# Patient Record
Sex: Male | Born: 1935 | Race: White | Hispanic: No | Marital: Married | Smoking: Never smoker
Health system: Southern US, Community
[De-identification: ages and names within clinical notes are randomized; demographics above are authoritative.]

## PROBLEM LIST (undated history)

## (undated) DIAGNOSIS — I482 Chronic atrial fibrillation, unspecified: Secondary | ICD-10-CM

## (undated) DIAGNOSIS — N400 Enlarged prostate without lower urinary tract symptoms: Secondary | ICD-10-CM

## (undated) DIAGNOSIS — I10 Essential (primary) hypertension: Secondary | ICD-10-CM

## (undated) DIAGNOSIS — E785 Hyperlipidemia, unspecified: Secondary | ICD-10-CM

## (undated) HISTORY — PX: BACK SURGERY: SHX140

## (undated) HISTORY — PX: EYE SURGERY: SHX253

## (undated) HISTORY — DX: Essential (primary) hypertension: I10

## (undated) HISTORY — DX: Benign prostatic hyperplasia without lower urinary tract symptoms: N40.0

## (undated) HISTORY — DX: Hyperlipidemia, unspecified: E78.5

## (undated) HISTORY — DX: Chronic atrial fibrillation, unspecified: I48.20

---

## 2011-12-06 DIAGNOSIS — I10 Essential (primary) hypertension: Secondary | ICD-10-CM | POA: Insufficient documentation

## 2011-12-06 DIAGNOSIS — Z136 Encounter for screening for cardiovascular disorders: Secondary | ICD-10-CM | POA: Insufficient documentation

## 2011-12-06 DIAGNOSIS — Z87898 Personal history of other specified conditions: Secondary | ICD-10-CM | POA: Insufficient documentation

## 2011-12-06 DIAGNOSIS — R0989 Other specified symptoms and signs involving the circulatory and respiratory systems: Secondary | ICD-10-CM | POA: Insufficient documentation

## 2011-12-06 DIAGNOSIS — R002 Palpitations: Secondary | ICD-10-CM | POA: Insufficient documentation

## 2011-12-06 DIAGNOSIS — E785 Hyperlipidemia, unspecified: Secondary | ICD-10-CM | POA: Insufficient documentation

## 2011-12-06 DIAGNOSIS — Z85820 Personal history of malignant melanoma of skin: Secondary | ICD-10-CM | POA: Insufficient documentation

## 2011-12-07 DIAGNOSIS — F1011 Alcohol abuse, in remission: Secondary | ICD-10-CM | POA: Insufficient documentation

## 2012-02-03 DIAGNOSIS — M1710 Unilateral primary osteoarthritis, unspecified knee: Secondary | ICD-10-CM | POA: Insufficient documentation

## 2012-05-20 DIAGNOSIS — N183 Chronic kidney disease, stage 3 unspecified: Secondary | ICD-10-CM | POA: Insufficient documentation

## 2012-09-03 DIAGNOSIS — D72821 Monocytosis (symptomatic): Secondary | ICD-10-CM | POA: Insufficient documentation

## 2014-08-24 DIAGNOSIS — H3554 Dystrophies primarily involving the retinal pigment epithelium: Secondary | ICD-10-CM | POA: Insufficient documentation

## 2014-08-24 DIAGNOSIS — H35371 Puckering of macula, right eye: Secondary | ICD-10-CM | POA: Insufficient documentation

## 2014-08-24 DIAGNOSIS — H3581 Retinal edema: Secondary | ICD-10-CM | POA: Insufficient documentation

## 2015-05-09 DIAGNOSIS — H02421 Myogenic ptosis of right eyelid: Secondary | ICD-10-CM | POA: Insufficient documentation

## 2015-10-03 DIAGNOSIS — H02831 Dermatochalasis of right upper eyelid: Secondary | ICD-10-CM | POA: Insufficient documentation

## 2015-10-03 DIAGNOSIS — H02403 Unspecified ptosis of bilateral eyelids: Secondary | ICD-10-CM | POA: Insufficient documentation

## 2015-10-03 DIAGNOSIS — H02834 Dermatochalasis of left upper eyelid: Secondary | ICD-10-CM | POA: Insufficient documentation

## 2015-10-03 DIAGNOSIS — H02413 Mechanical ptosis of bilateral eyelids: Secondary | ICD-10-CM | POA: Insufficient documentation

## 2016-04-16 DIAGNOSIS — I2510 Atherosclerotic heart disease of native coronary artery without angina pectoris: Secondary | ICD-10-CM | POA: Insufficient documentation

## 2016-05-14 DIAGNOSIS — I447 Left bundle-branch block, unspecified: Secondary | ICD-10-CM | POA: Insufficient documentation

## 2016-08-26 DIAGNOSIS — Z7901 Long term (current) use of anticoagulants: Secondary | ICD-10-CM | POA: Insufficient documentation

## 2016-11-12 ENCOUNTER — Ambulatory Visit (INDEPENDENT_AMBULATORY_CARE_PROVIDER_SITE_OTHER): Payer: Medicare Other | Admitting: Internal Medicine

## 2016-11-12 VITALS — BP 140/76 | HR 62

## 2016-11-12 DIAGNOSIS — I482 Chronic atrial fibrillation, unspecified: Secondary | ICD-10-CM

## 2016-11-12 DIAGNOSIS — R0609 Other forms of dyspnea: Secondary | ICD-10-CM

## 2016-11-12 NOTE — Progress Notes (Signed)
ELECTROPHYSIOLOGY CONSULT NOTE  Patient ID: Adam Barajas, MRN: [id number], DOB/AGE: 1929-11-28 83 y.o. Admit date: (Not on file) Date of Consult: 11/12/2016  Primary Physician: Dortha Kern, MD Primary Cardiologist: Cameron Ali  Consulting Physician Bliss  Chief Complaint: Atrial fibrillation and exercise intolerance    HPI Adam Barajas is a 83 y.o. male  Referred at his own request because of frustrations related to worsening exercise tolerance.  He wonders whether there is value to pacing.  He has a long-standing history of atrial fibrillation. This goes back at least 5+ years. He has been seen at [facility].  Cardiac evaluation has included stress testing which I do not been able to bring up. Myoview 6/17 demonstrated no ischemia. Echocardiogram at that same time demonstrated ejection fraction of 40 or so percent. Interestingly, left atrial dimension was less than it had been noted a couple years before at 4.2--<<5.0  remote catheterization 2009 demonstrated nonobstructive disease.  He was seen by EP in 2014 for consideration of pacing. Holter monitoring at that time is reported as having demonstrated some nocturnal bradycardia and rates in the 70s and peak rates in the 110 range. No indication for pacing was evident.  More recently his exercise tolerance has been problematic. He feels that relatively unpredictably his ability to accomplish tasks is significantly worse than at other times. In this regard it is notable that he is in permanent atrial fibrillation. Overall however, his exercise tolerance is considerably worse in a year ago. He notes that when he moves a wheelbarrow 1 for his fireplace that he needs to stop 3 or 4 times pushing barrel up a hill.  He does not have resting dyspnea orthopnea. He has a little bit of peripheral edema. He has had no tachycardia palpitations.  He has had no bleeding issues on anticoagulation which has been warfarin for some time.  Reviewed  from [facility] through [facility] Everywhere  They are frequently notes referring to heavy alcohol use.       Past Medical History:  Diagnosis Date  . BPH (benign prostatic hyperplasia)   . Chronic atrial fibrillation (HCC)   . Hyperlipidemia   . Hypertension       Surgical History: No past surgical history on file.   Home Meds: Prior to Admission medications   Medication Sig Start Date End Date Taking? Authorizing Provider  albuterol (PROVENTIL HFA;VENTOLIN HFA) 108 (90 Base) MCG/ACT inhaler Inhale into the lungs every 6 (six) hours as needed for wheezing or shortness of breath.    Historical Provider, MD  atorvastatin (LIPITOR) 40 MG tablet Take 40 mg by mouth daily.    Historical Provider, MD  carvedilol (COREG) 6.25 MG tablet Take 6.25 mg by mouth 2 (two) times daily with a meal.    Historical Provider, MD  cyclobenzaprine (FLEXERIL) 5 MG tablet Take 5 mg by mouth 3 (three) times daily as needed for muscle spasms.    Historical Provider, MD  furosemide (LASIX) 40 MG tablet Take 40 mg by mouth daily.    Historical Provider, MD  losartan (COZAAR) 25 MG tablet Take 25 mg by mouth daily.    Historical Provider, MD  tamsulosin (FLOMAX) 0.4 MG CAPS capsule Take 0.4 mg by mouth.    Historical Provider, MD  warfarin (COUMADIN) 5 MG tablet Take 5 mg by mouth daily.    Historical Provider, MD    Allergies:  Allergies  Allergen Reactions  . Amoxicillin-Pot Clavulanate Hives  . Poison Ivy Extract  Social History   Social History  . Marital status: Married    Spouse name: N/A  . Number of children: N/A  . Years of education: N/A   Occupational History  . Not on file.   Social History Main Topics  . Smoking status: Never Smoker  . Smokeless tobacco: Never Used  . Alcohol use Yes     Comment: 2-3  drinks per day.   . Drug use: No  . Sexual activity: Not on file   Other Topics Concern  . Not on file   Social History Narrative  . No narrative on file     Family History    Problem Relation Age of Onset  . Hyperlipidemia Father      ROS:  Please see the history of present illness.     All other systems reviewed and negative.    Physical Exam: Blood pressure 140/76, pulse 62. General: Well developed, well nourished male in no acute distress. Head: Normocephalic, atraumatic, sclera non-icteric, no xanthomas, nares are without discharge. EENT: normal  Lymph Nodes:  none Neck: Negative for carotid bruits. JVD not elevated. Back:without scoliosis kyphosis Lungs: Clear bilaterally to auscultation without wheezes, rales, or rhonchi. Breathing is unlabored. Heart: Irregularly irregular rate and rhythm with a 2 /6 systolic murmur . No rubs, or gallops appreciated. Abdomen: Soft, non-tender, non-distended with normoactive bowel sounds. No hepatomegaly. No rebound/guarding. No obvious abdominal masses. Msk:  Strength and tone appear normal for age. Extremities: No clubbing or cyanosis. No  edema.  Distal pedal pulses are 2+ and equal bilaterally. Skin: Warm and Dry Neuro: Alert and oriented X 3. CN III-XII intact Grossly normal sensory and motor function . Psych:  Responds to questions appropriately with a normal affect.      Labs: Cardiac Enzymes No results for input(s): CKTOTAL, CKMB, TROPONINI in the last 72 hours. CBC No results found for: WBC, HGB, HCT, MCV, PLT PROTIME: No results for input(s): LABPROT, INR in the last 72 hours. Chemistry No results for input(s): NA, K, CL, CO2, BUN, CREATININE, CALCIUM, PROT, BILITOT, ALKPHOS, ALT, AST, GLUCOSE in the last 168 hours.  Invalid input(s): LABALBU Lipids No results found for: CHOL, HDL, LDLCALC, TRIG BNP No results found for: PROBNP Thyroid Function Tests: No results for input(s): TSH, T4TOTAL, T3FREE, THYROIDAB in the last 72 hours.  Invalid input(s): FREET3 Miscellaneous No results found for: DDIMER  Radiology/Studies:  No results found.  EKG: Atrial fibrillation at  62 Intervals-/14/44 Axis deviation Left bundle branch block   Assessment and Plan:  Atrial fibrillation-permanent  Left bundle branch block  Nonischemic cardiomyopathy  Dyspnea on exertion/congestive heart failure  Myelodysplastic syndrome  Carotid disease with yearly follow-up recommended    The patient has atrial fibrillation that is permanent. His questions as to whether he would benefit from pacing. An office stress test today( with leg kicks)  suggest there may be a component of chronotropic incompetence as he was able only to generate a heart rate of 72. We will undertake cardiopulmonary stress testing looking for heart rate excursion to see if we can identify the source of his functional limitation.  His QRS duration is modestly prolonged. ECG from 3/14 had a QRS duration of 146; today is 136. I am not sure that his left bundle branch block is sufficiently broad to make it likely that resynchronization particularly in the context of atrial fibrillation is likely to benefit him.  The event that chronotropic competence is evident, resynchronization and/or His bundle pacing would be  a reasonable step.  For his cardiomyopathy he is managed with beta blockers and ARB.         Sherryl Manges

## 2016-11-12 NOTE — Patient Instructions (Addendum)
Medication Instructions: - Your physician recommends that you continue on your current medications as directed. Please refer to the Current Medication list given to you today.  Labwork: - none ordered  Procedures/Testing: - Your physician has recommended that you have a cardiopulmonary stress test (CPX). CPX testing is a non-invasive measurement of heart and lung function. It replaces a traditional treadmill stress test. This type of test provides a tremendous amount of information that relates not only to your present condition but also for future outcomes. This test combines measurements of you ventilation, respiratory gas exchange in the lungs, electrocardiogram (EKG), blood pressure and physical response before, during, and following an exercise protocol.  ** This will be scheduled to be done at [facility] in [place]**  Follow-Up: - Your physician recommends that you schedule a follow-up appointment in: 2 weeks after the CPX test is done with Dr. Graciela Husbands.   Any Additional Special Instructions Will Be Listed Below (If Applicable).     If you need a refill on your cardiac medications before your next appointment, please call your pharmacy.

## 2016-11-19 ENCOUNTER — Other Ambulatory Visit (HOSPITAL_COMMUNITY): Payer: Self-pay | Admitting: *Deleted

## 2016-11-19 ENCOUNTER — Ambulatory Visit (HOSPITAL_COMMUNITY): Payer: Medicare Other | Attending: Internal Medicine

## 2016-11-19 DIAGNOSIS — R0600 Dyspnea, unspecified: Secondary | ICD-10-CM

## 2016-11-19 DIAGNOSIS — R0609 Other forms of dyspnea: Secondary | ICD-10-CM | POA: Diagnosis not present

## 2016-11-28 ENCOUNTER — Encounter: Payer: Self-pay | Admitting: Internal Medicine

## 2016-11-28 ENCOUNTER — Ambulatory Visit (INDEPENDENT_AMBULATORY_CARE_PROVIDER_SITE_OTHER): Payer: Medicare Other | Admitting: Internal Medicine

## 2016-11-28 VITALS — BP 132/64 | HR 53 | Ht 65.0 in | Wt 154.2 lb

## 2016-11-28 DIAGNOSIS — I482 Chronic atrial fibrillation: Secondary | ICD-10-CM

## 2016-11-28 DIAGNOSIS — I428 Other cardiomyopathies: Secondary | ICD-10-CM

## 2016-11-28 DIAGNOSIS — I447 Left bundle-branch block, unspecified: Secondary | ICD-10-CM

## 2016-11-28 DIAGNOSIS — I4821 Permanent atrial fibrillation: Secondary | ICD-10-CM

## 2016-11-28 NOTE — Patient Instructions (Addendum)
Medication Instructions: - Your physician has recommended you make the following change in your medication:  1) Decrease coreg (carvedilol) 6.25 mg- take 1/2 tablet (3.125 mg) by mouth twice daily  Labwork: - none ordered   Procedures/Testing: - none ordered  Follow-Up: - Your physician recommends that you schedule a follow-up appointment in: 3 months with Dr. Graciela Husbands.  Any Additional Special Instructions Will Be Listed Below (If Applicable).     If you need a refill on your cardiac medications before your next appointment, please call your pharmacy.

## 2016-11-28 NOTE — Progress Notes (Signed)
Patient Care Team: Dortha Kern, MD as PCP - General (Family Medicine)   HPI  Adam Barajas is a 83 y.o. male Seen in follow-up for exercise associated dyspnea with in office exercise testing left (leg kicks) worrisome for chronotropic incompetence.  He has permanent atrial fibrillation and a mild resumed nonischemic cardiomyopathy. He has ongoing episodes of dizziness which in his mind correlate with slow pulses.  He underwent cardiopulmonary stress testing. Maximum heart rate was 121  no significant limitations were identified  DATE TEST    6/17    myoview   EF 52 %  no ischemia  6/17    echo   EF 40-45 %  Mod LAE          Records and Results Reviewed   Past Medical History:  Diagnosis Date  . BPH (benign prostatic hyperplasia)   . Chronic atrial fibrillation (HCC)   . Hyperlipidemia   . Hypertension     History reviewed. No pertinent surgical history.  Current Outpatient Prescriptions  Medication Sig Dispense Refill  . albuterol (PROVENTIL HFA;VENTOLIN HFA) 108 (90 Base) MCG/ACT inhaler Inhale into the lungs every 6 (six) hours as needed for wheezing or shortness of breath.    Marland Kitchen atorvastatin (LIPITOR) 40 MG tablet Take 40 mg by mouth daily.    . carvedilol (COREG) 6.25 MG tablet Take 6.25 mg by mouth 2 (two) times daily with a meal.    . cyclobenzaprine (FLEXERIL) 5 MG tablet Take 5 mg by mouth 3 (three) times daily as needed for muscle spasms.    . furosemide (LASIX) 40 MG tablet Take 40 mg by mouth daily.    Marland Kitchen losartan (COZAAR) 25 MG tablet Take 25 mg by mouth daily.    . tamsulosin (FLOMAX) 0.4 MG CAPS capsule Take 0.4 mg by mouth.    . warfarin (COUMADIN) 5 MG tablet Take 5 mg by mouth daily.     No current facility-administered medications for this visit.     Allergies  Allergen Reactions  . Amoxicillin-Pot Clavulanate Hives  . Poison Ivy Extract       Review of Systems negative except from HPI and PMH  Physical Exam BP 132/64 (BP Location:  Left Arm, Patient Position: Sitting, Cuff Size: Normal)   Pulse (!) 53   Ht 5\' 5"  (1.651 m)   Wt 154 lb 4 oz (70 kg)   BMI 25.67 kg/m  Well developed and well nourished in no acute distress HENT normal E scleral and icterus clear Neck Supple JVP flat; carotids brisk and full Clear to ausculation irregular rate and rhythm, no murmurs gallops or rub Soft with active bowel sounds No clubbing cyanosis  Edema Alert and oriented, grossly normal motor and sensory function Skin Warm and Dry  ECG demonstrates atrial fibrillation at 53 Intervals-/13/46 Left bundle branch block  Assessment and  Plan  Left bundle branch block  Cardiomyopathy-mild  Atrial fibrillation-permanent  Dizziness  Bradycardia    Cardiopulmonary stress testing demonstrated adequate heart rate excursion and without excessive heart rate excursion. Given his ongoing symptoms of dizziness associated with him taking his pulse and feeling slow, we will decrease his carvedilol from 6.25--3.125 twice daily. In the event that he continues to have episodes, we would undertake a monitor of sufficient duration i.e. 15/30 days so as to look for correlation between rate and symptoms.  His QRS duration is not of sufficient breath to justify pacing for the left bundle branch block.  We discussed the  use of the NOACs compared to Coumadin. We briefly reviewed the data of at least comparability in stroke prevention, bleeding and outcome. We discussed some of the new once wherein somewhat associated with decreased ischemic stroke risk, one to be taken daily, and has been shown to be comparable and bleeding risk to aspirin.  We also discussed bleeding associated with warfarin as well as NOACs and a wall bleeding as a complication of all these drugs intracranial bleeding is more frequently associated with warfarin then the NOACs and a GI bleeding is more commonly associated with the latter  He will look into the costs   We spent  more than 50% of our >25 min visit in face to face counseling regarding the above     Current medicines are reviewed at length with the patient today .  The patient does not  have concerns regarding medicines.

## 2017-02-20 ENCOUNTER — Ambulatory Visit (INDEPENDENT_AMBULATORY_CARE_PROVIDER_SITE_OTHER): Payer: Medicare Other | Admitting: Internal Medicine

## 2017-02-20 ENCOUNTER — Encounter: Payer: Self-pay | Admitting: Internal Medicine

## 2017-02-20 VITALS — BP 132/60 | HR 69 | Ht 66.5 in | Wt 158.0 lb

## 2017-02-20 DIAGNOSIS — I4821 Permanent atrial fibrillation: Secondary | ICD-10-CM

## 2017-02-20 DIAGNOSIS — R001 Bradycardia, unspecified: Secondary | ICD-10-CM | POA: Diagnosis not present

## 2017-02-20 DIAGNOSIS — I447 Left bundle-branch block, unspecified: Secondary | ICD-10-CM

## 2017-02-20 DIAGNOSIS — I428 Other cardiomyopathies: Secondary | ICD-10-CM | POA: Diagnosis not present

## 2017-02-20 DIAGNOSIS — I482 Chronic atrial fibrillation: Secondary | ICD-10-CM | POA: Diagnosis not present

## 2017-02-20 NOTE — Patient Instructions (Signed)

## 2017-02-20 NOTE — Progress Notes (Signed)
Patient Care Team: Dortha Kern, MD as PCP - General (Family Medicine)   HPI  Adam Barajas is a 83 y.o. male Seen in follow-up for exercise associated dyspnea.  He has permanent atrial fibrillation and a mild resumed nonischemic cardiomyopathy  He underwent cardiopulmonary stress testing. Maximum heart rate was 121  No significant limitations were identified.   He has baseline left bundle branch block  Functional status remains pretty good but variable. Some days he can do it once other days he finds it harder. He does not notice correlations between good days preceding bad days, temperature,  He has ongoing episodes of dizziness which in his mind correlate with slow pulses.   DATE TEST    6/17    myoview   EF 52 %  no ischemia  6/17    echo   EF 40-45 %  Mod LAE          Records and Results Reviewed   Past Medical History:  Diagnosis Date  . BPH (benign prostatic hyperplasia)   . Chronic atrial fibrillation (HCC)   . Hyperlipidemia   . Hypertension     History reviewed. No pertinent surgical history.  Current Outpatient Prescriptions  Medication Sig Dispense Refill  . albuterol (PROVENTIL HFA;VENTOLIN HFA) 108 (90 Base) MCG/ACT inhaler Inhale into the lungs every 6 (six) hours as needed for wheezing or shortness of breath.    Marland Kitchen atorvastatin (LIPITOR) 40 MG tablet Take 40 mg by mouth daily.    . carvedilol (COREG) 6.25 MG tablet Take 1/2 tablet (3.125 mg) by mouth twice daily    . cyclobenzaprine (FLEXERIL) 5 MG tablet Take 5 mg by mouth 3 (three) times daily as needed for muscle spasms.    . furosemide (LASIX) 40 MG tablet Take 40 mg by mouth daily.    Marland Kitchen losartan (COZAAR) 25 MG tablet Take 25 mg by mouth daily.    . tamsulosin (FLOMAX) 0.4 MG CAPS capsule Take 0.4 mg by mouth.    . warfarin (COUMADIN) 5 MG tablet Take 5 mg by mouth daily.     No current facility-administered medications for this visit.     Allergies  Allergen Reactions  .  Amoxicillin-Pot Clavulanate Hives  . Poison Ivy Extract       Review of Systems negative except from HPI and PMH  Physical Exam BP 132/60 (BP Location: Left Arm, Patient Position: Sitting, Cuff Size: Normal)   Pulse 69   Ht 5' 6.5" (1.689 m)   Wt 158 lb (71.7 kg)   BMI 25.12 kg/m  Well developed and well nourished in no acute distress HENT normal E scleral and icterus clear Neck Supple JVP flat; carotids brisk and full Clear to ausculation irregular rate and rhythm, no murmurs gallops or rub Soft with active bowel sounds No clubbing cyanosis  Edema Alert and oriented, grossly normal motor and sensory function Skin Warm and Dry  ECG Personally reviewed  demonstrates atrial fibrillation at 69 Intervals-/13/44 Left bundle branch block  Assessment and  Plan  Left bundle branch block  Cardiomyopathy-mild  Atrial fibrillation-permanent  Bradycardia  Hypertension   DOE      Not sure of the mechanism of his variable exercise tolerance.  For now will continue bb and ARB given cardiomyopathy.  HR is better with downtitration of beta blocker although no significant change in exercise  Cardiomyopathy and LBBB, although related somehow--which cause and which effect-- should not explain the variability in daily function  Would like to continue to follow here as opposed to returning to [facility]   BP well controlled  Euvolemic continue current meds   Current medicines are reviewed at length with the patient today .  The patient does not  have concerns regarding medicines.

## 2017-08-28 ENCOUNTER — Encounter: Payer: Self-pay | Admitting: Internal Medicine

## 2017-08-28 ENCOUNTER — Ambulatory Visit (INDEPENDENT_AMBULATORY_CARE_PROVIDER_SITE_OTHER): Payer: Medicare Other | Admitting: Internal Medicine

## 2017-08-28 ENCOUNTER — Other Ambulatory Visit
Admission: RE | Admit: 2017-08-28 | Discharge: 2017-08-28 | Disposition: A | Discharge status: Home / Self Care | Payer: Medicare Other | Source: Ambulatory Visit | Attending: Internal Medicine | Admitting: Internal Medicine

## 2017-08-28 VITALS — BP 144/70 | HR 78 | Ht 67.0 in | Wt 159.8 lb

## 2017-08-28 DIAGNOSIS — I4891 Unspecified atrial fibrillation: Secondary | ICD-10-CM | POA: Insufficient documentation

## 2017-08-28 DIAGNOSIS — Z79899 Other long term (current) drug therapy: Secondary | ICD-10-CM | POA: Diagnosis not present

## 2017-08-28 LAB — CBC WITH DIFFERENTIAL/PLATELET
BASOS PCT: 1 %
Basophils Absolute: 0.1 10*3/uL (ref 0–0.1)
Eosinophils Absolute: 0.2 10*3/uL (ref 0–0.7)
Eosinophils Relative: 2 %
HCT: 37.9 % — ABNORMAL LOW (ref 40.0–52.0)
Hemoglobin: 12.6 g/dL — ABNORMAL LOW (ref 13.0–18.0)
Lymphocytes Relative: 18 %
Lymphs Abs: 1.5 10*3/uL (ref 1.0–3.6)
MCH: 32 pg (ref 26.0–34.0)
MCHC: 33.2 g/dL (ref 32.0–36.0)
MCV: 96.2 fL (ref 80.0–100.0)
MONO ABS: 1.2 10*3/uL — AB (ref 0.2–1.0)
MONOS PCT: 15 %
NEUTROS PCT: 64 %
Neutro Abs: 5 10*3/uL (ref 1.4–6.5)
Platelets: 193 10*3/uL (ref 150–440)
RBC: 3.95 MIL/uL — ABNORMAL LOW (ref 4.40–5.90)
RDW: 13.7 % (ref 11.5–14.5)
WBC: 7.9 10*3/uL (ref 3.8–10.6)

## 2017-08-28 LAB — BASIC METABOLIC PANEL
Anion gap: 13 (ref 5–15)
BUN: 31 mg/dL — ABNORMAL HIGH (ref 6–20)
CALCIUM: 9.9 mg/dL (ref 8.9–10.3)
CO2: 25 mmol/L (ref 22–32)
Chloride: 103 mmol/L (ref 101–111)
Creatinine, Ser: 0.95 mg/dL (ref 0.61–1.24)
GFR calc non Af Amer: >60 mL/min (ref >60)
GLUCOSE: 108 mg/dL — AB (ref 65–99)
Potassium: 4.6 mmol/L (ref 3.5–5.1)
Sodium: 141 mmol/L (ref 135–145)

## 2017-08-28 NOTE — Patient Instructions (Addendum)
Medication Instructions:  Your physician recommends that you continue on your current medications as directed. Please refer to the Current Medication list given to you today.   Labwork: Your physician recommends that you return for lab work in: TODAY (CBC, BMET). - Please go to the [facility]. You will check in at the front desk to the right as you walk into the atrium. Valet Parking is offered if needed.    Testing/Procedures: NONE  Follow-Up: Your physician wants you to follow-up in: 6-7 MONTHS WITH DR Graciela Husbands. You will receive a reminder letter in the mail two months in advance. If you don't receive a letter, please call our office to schedule the follow-up appointment.  If you need a refill on your cardiac medications before your next appointment, please call your pharmacy.

## 2017-08-28 NOTE — Progress Notes (Signed)
Patient Care Team: Dortha Kern, MD as PCP - General (Family Medicine)   HPI  Adam Barajas is a 83 y.o. male Seen in follow-up for exercise associated dyspnea.  He has permanent atrial fibrillation and a mild  nonischemic cardiomyopathy  He underwent cardiopulmonary stress testing. Maximum heart rate was 121  No significant limitations were identified.   He has baseline left bundle branch block  Functional status is pretty good. Occasional complaints of dyspnea with significant exertion. No chest pain. No edema. No bleeding.  No interval dizziness.   DATE TEST    6/17    myoview   EF 52 %  no ischemia  6/17    echo   EF 40-45 %  Mod LAE        Date Cr K Hgb  1/17 1.0 4.9    8/17    12.7     Records and Results Reviewed   Past Medical History:  Diagnosis Date  . BPH (benign prostatic hyperplasia)   . Chronic atrial fibrillation (HCC)   . Hyperlipidemia   . Hypertension     History reviewed. No pertinent surgical history.  Current Outpatient Prescriptions  Medication Sig Dispense Refill  . albuterol (PROVENTIL HFA;VENTOLIN HFA) 108 (90 Base) MCG/ACT inhaler Inhale into the lungs every 6 (six) hours as needed for wheezing or shortness of breath.    Marland Kitchen atorvastatin (LIPITOR) 40 MG tablet Take 40 mg by mouth daily.    . carvedilol (COREG) 6.25 MG tablet Take 1/2 tablet (3.125 mg) by mouth twice daily    . cyclobenzaprine (FLEXERIL) 5 MG tablet Take 5 mg by mouth 3 (three) times daily as needed for muscle spasms.    . furosemide (LASIX) 40 MG tablet Take 40 mg by mouth daily.    Marland Kitchen losartan (COZAAR) 25 MG tablet Take 25 mg by mouth daily.    . tamsulosin (FLOMAX) 0.4 MG CAPS capsule Take 0.4 mg by mouth.    . warfarin (COUMADIN) 5 MG tablet Take 5 mg by mouth daily.     No current facility-administered medications for this visit.     Allergies  Allergen Reactions  . Amoxicillin-Pot Clavulanate Hives  . Poison Ivy Extract       Review of Systems  negative except from HPI and PMH  Physical Exam BP (!) 144/70 (BP Location: Left Arm, Patient Position: Sitting, Cuff Size: Normal)   Pulse 78   Ht 5\' 7"  (1.702 m)   Wt 159 lb 12 oz (72.5 kg)   BMI 25.02 kg/m  Well developed and nourished in no acute distress HENT normal Neck supple with JVP-flat Carotids brisk and full without bruits Clear Irregularly irregular rate and rhythm with controlled ventricular response, no murmurs or gallops Abd-soft with active BS without hepatomegaly No Clubbing cyanosis edema Skin-warm and dry A & Oriented  Grossly normal sensory and motor function   ECG Personally reviewed atrial fibrillation at 78 Intervals-/13/41 Left bundle branch block   Assessment and  Plan  Left bundle branch block  Cardiomyopathy-mild  Atrial fibrillation-permanent  Hypertension   DOE   BP reasonably controlled at home  Euvolemic continue current meds  On Anticoagulation;  No bleeding issues   Will check BMET and Hgb  Current medicines are reviewed at length with the patient today .  The patient does not  have concerns regarding medicines.  We spent more than 50% of our >25 min visit in face to face counseling regarding the above

## 2017-09-01 ENCOUNTER — Telehealth: Payer: Self-pay

## 2017-09-01 NOTE — Telephone Encounter (Signed)
Spoke with Patients wife [name] per DPR who is aware and agreeable to results. Per her request I will fax results to PCP so that if PCP would like to manage elevated blood sugar and mild anemia she can. I am agreeable.

## 2018-05-28 ENCOUNTER — Ambulatory Visit (INDEPENDENT_AMBULATORY_CARE_PROVIDER_SITE_OTHER): Payer: Medicare Other | Admitting: Internal Medicine

## 2018-05-28 ENCOUNTER — Encounter: Payer: Self-pay | Admitting: Internal Medicine

## 2018-05-28 DIAGNOSIS — I447 Left bundle-branch block, unspecified: Secondary | ICD-10-CM | POA: Diagnosis not present

## 2018-05-28 DIAGNOSIS — I482 Chronic atrial fibrillation: Secondary | ICD-10-CM | POA: Diagnosis not present

## 2018-05-28 DIAGNOSIS — I428 Other cardiomyopathies: Secondary | ICD-10-CM | POA: Diagnosis not present

## 2018-05-28 DIAGNOSIS — I4821 Permanent atrial fibrillation: Secondary | ICD-10-CM

## 2018-05-28 NOTE — Progress Notes (Signed)
      Patient Care Team: Dortha Kern, MD as PCP - General (Family Medicine)   HPI  Adam Barajas is a 84 y.o. male Seen in follow-up for exercise associated dyspnea.  He has permanent atrial fibrillation and a mild  nonischemic cardiomyopathy  He underwent cardiopulmonary stress testing. Maximum heart rate was 121  No significant limitations were identified.   He has baseline left bundle branch block  The patient denies chest pain, shortness of breath, nocturnal dyspnea, orthopnea or peripheral edema.  There have been no palpitations, lightheadedness or syncope.    Biggest problem is numbness in his left arm for which he has seen neurology and neurosurgery but surgery has been excluded  DATE TEST    6/17    myoview   EF 52 %  no ischemia  6/17    echo   EF 40-45 %  Mod LAE        Date Cr K Hgb  1/17 1.0 4.9    8/17    12.7  10/18 0.95 4.6 12.6  7/19   12.6     Records and Results Reviewed   Past Medical History:  Diagnosis Date  . BPH (benign prostatic hyperplasia)   . Chronic atrial fibrillation (HCC)   . Hyperlipidemia   . Hypertension     History reviewed. No pertinent surgical history.  Current Outpatient Medications  Medication Sig Dispense Refill  . albuterol (PROVENTIL HFA;VENTOLIN HFA) 108 (90 Base) MCG/ACT inhaler Inhale into the lungs every 6 (six) hours as needed for wheezing or shortness of breath.    Marland Kitchen atorvastatin (LIPITOR) 40 MG tablet Take 40 mg by mouth daily.    . carvedilol (COREG) 6.25 MG tablet Take 1/2 tablet (3.125 mg) by mouth twice daily    . cyclobenzaprine (FLEXERIL) 5 MG tablet Take 5 mg by mouth 3 (three) times daily as needed for muscle spasms.    . furosemide (LASIX) 40 MG tablet Take 40 mg by mouth daily.    Marland Kitchen losartan (COZAAR) 25 MG tablet Take 25 mg by mouth daily.    . tamsulosin (FLOMAX) 0.4 MG CAPS capsule Take 0.4 mg by mouth.    . warfarin (COUMADIN) 5 MG tablet Take 5 mg by mouth daily.     No current  facility-administered medications for this visit.     Allergies  Allergen Reactions  . Amoxicillin-Pot Clavulanate Hives  . Poison Ivy Extract       Review of Systems negative except from HPI and PMH  Physical Exam BP 140/76 (BP Location: Left Arm, Patient Position: Sitting, Cuff Size: Normal)   Pulse 61   Ht 5' 7.5" (1.715 m)   Wt 157 lb 8 oz (71.4 kg)   BMI 24.30 kg/m  Well developed and nourished in no acute distress HENT normal Neck supple with JVP-flat Carotids brisk and full without bruits Clear Irregularly irregular rate and rhythm with controlled ventricular response, no murmurs or gallops Abd-soft with active BS without hepatomegaly No Clubbing cyanosis edema Skin-warm and dry A & Oriented  Grossly normal sensory and motor function   ECG atrial fibrillation at 61 Intervals-/13/46 Axis left -36 Assessment and  Plan  Left bundle branch block  Cardiomyopathy-mild  Atrial fibrillation-permanent  Hypertension   Blood pressure reasonably controlled.  Euvolemic continue current meds  On Anticoagulation;  No bleeding issues

## 2018-05-28 NOTE — Patient Instructions (Signed)

## 2018-10-07 DIAGNOSIS — M47812 Spondylosis without myelopathy or radiculopathy, cervical region: Secondary | ICD-10-CM | POA: Insufficient documentation

## 2019-12-03 DIAGNOSIS — R600 Localized edema: Secondary | ICD-10-CM | POA: Insufficient documentation

## 2019-12-03 DIAGNOSIS — L03115 Cellulitis of right lower limb: Secondary | ICD-10-CM | POA: Insufficient documentation

## 2019-12-04 DIAGNOSIS — F0390 Unspecified dementia without behavioral disturbance: Secondary | ICD-10-CM | POA: Insufficient documentation

## 2019-12-04 DIAGNOSIS — N179 Acute kidney failure, unspecified: Secondary | ICD-10-CM | POA: Insufficient documentation

## 2019-12-12 DIAGNOSIS — D692 Other nonthrombocytopenic purpura: Secondary | ICD-10-CM | POA: Insufficient documentation

## 2020-04-19 DIAGNOSIS — H35351 Cystoid macular degeneration, right eye: Secondary | ICD-10-CM | POA: Insufficient documentation

## 2020-06-27 ENCOUNTER — Ambulatory Visit (INDEPENDENT_AMBULATORY_CARE_PROVIDER_SITE_OTHER): Payer: Medicare Other | Admitting: Internal Medicine

## 2020-06-27 ENCOUNTER — Ambulatory Visit: Payer: Medicare Other | Admitting: Internal Medicine

## 2020-06-27 ENCOUNTER — Other Ambulatory Visit: Payer: Self-pay

## 2020-06-27 ENCOUNTER — Encounter: Payer: Self-pay | Admitting: Internal Medicine

## 2020-06-27 VITALS — BP 102/60 | HR 48 | Ht 67.5 in | Wt 136.8 lb

## 2020-06-27 DIAGNOSIS — I447 Left bundle-branch block, unspecified: Secondary | ICD-10-CM | POA: Diagnosis not present

## 2020-06-27 DIAGNOSIS — R0600 Dyspnea, unspecified: Secondary | ICD-10-CM

## 2020-06-27 DIAGNOSIS — I429 Cardiomyopathy, unspecified: Secondary | ICD-10-CM

## 2020-06-27 DIAGNOSIS — R0609 Other forms of dyspnea: Secondary | ICD-10-CM

## 2020-06-27 DIAGNOSIS — I10 Essential (primary) hypertension: Secondary | ICD-10-CM

## 2020-06-27 DIAGNOSIS — I4821 Permanent atrial fibrillation: Secondary | ICD-10-CM

## 2020-06-27 MED ORDER — APIXABAN 2.5 MG PO TABS: 2.5000 mg | ORAL_TABLET | Freq: Two times a day (BID) | ORAL | 3 refills | Status: DC

## 2020-06-27 NOTE — Patient Instructions (Addendum)
Medication Instructions:  - Your physician has recommended you make the following change in your medication:   1) STOP coreg (carvedilol)  2) DECREASE eliquis to 2.5 mg- take 1 tablet by mouth twice daily  3) INCREASE lasix (furosemide) 40 mg- take 1 tablet by mouth twice daily x 5 days, then resume 1 tablet by mouth once daily   *If you need a refill on your cardiac medications before your next appointment, please call your pharmacy*   Lab Work: - Your physician recommends that you have lab work today: BNP/ IRON panel / CBC  If you have labs (blood work) drawn today and your tests are completely normal, you will receive your results only by: Marland Kitchen MyChart Message (if you have MyChart) OR . A paper copy in the mail If you have any lab test that is abnormal or we need to change your treatment, we will call you to review the results.   Testing/Procedures: - Your physician has requested that you have an echocardiogram. Echocardiography is a painless test that uses sound waves to create images of your heart. It provides your doctor with information about the size and shape of your heart and how well your heart's chambers and valves are working. This procedure takes approximately one hour. There are no restrictions for this procedure.   Follow-Up: At [facility], you and your health needs are our priority.  As part of our continuing mission to provide you with exceptional heart care, we have created designated Provider Care Teams.  These Care Teams include your primary Cardiologist (physician) and Advanced Practice Providers (APPs -  Physician Assistants and Nurse Practitioners) who all work together to provide you with the care you need, when you need it.  We recommend signing up for the patient portal called "MyChart".  Sign up information is provided on this After Visit Summary.  MyChart is used to connect with patients for Virtual Visits (Telemedicine).  Patients are able to view lab/test  results, encounter notes, upcoming appointments, etc.  Non-urgent messages can be sent to your provider as well.   To learn more about what you can do with MyChart, go to [email or web address].    Your next appointment:   6 month(s)  The format for your next appointment:   In Person  Provider:   Sherryl Manges, MD   Other Instructions  Echocardiogram An echocardiogram is a procedure that uses painless sound waves (ultrasound) to produce an image of the heart. Images from an echocardiogram can provide important information about:  Signs of coronary artery disease (CAD).  Aneurysm detection. An aneurysm is a weak or damaged part of an artery wall that bulges out from the normal force of blood pumping through the body.  Heart size and shape. Changes in the size or shape of the heart can be associated with certain conditions, including heart failure, aneurysm, and CAD.  Heart muscle function.  Heart valve function.  Signs of a past heart attack.  Fluid buildup around the heart.  Thickening of the heart muscle.  A tumor or infectious growth around the heart valves. Tell a health care provider about:  Any allergies you have.  All medicines you are taking, including vitamins, herbs, eye drops, creams, and over-the-counter medicines.  Any blood disorders you have.  Any surgeries you have had.  Any medical conditions you have.  Whether you are pregnant or may be pregnant. What are the risks? Generally, this is a safe procedure. However, problems may occur, including:  Allergic  reaction to dye (contrast) that may be used during the procedure. What happens before the procedure? No specific preparation is needed. You may eat and drink normally. What happens during the procedure?   An IV tube may be inserted into one of your veins.  You may receive contrast through this tube. A contrast is an injection that improves the quality of the pictures from your heart.  A gel  will be applied to your chest.  A wand-like tool (transducer) will be moved over your chest. The gel will help to transmit the sound waves from the transducer.  The sound waves will harmlessly bounce off of your heart to allow the heart images to be captured in real-time motion. The images will be recorded on a computer. The procedure may vary among health care providers and hospitals. What happens after the procedure?  You may return to your normal, everyday life, including diet, activities, and medicines, unless your health care provider tells you not to do that. Summary  An echocardiogram is a procedure that uses painless sound waves (ultrasound) to produce an image of the heart.  Images from an echocardiogram can provide important information about the size and shape of your heart, heart muscle function, heart valve function, and fluid buildup around your heart.  You do not need to do anything to prepare before this procedure. You may eat and drink normally.  After the echocardiogram is completed, you may return to your normal, everyday life, unless your health care provider tells you not to do that. This information is not intended to replace advice given to you by your health care provider. Make sure you discuss any questions you have with your health care provider. Document Revised: 02/11/2019 Document Reviewed: 11/23/2016 Elsevier Patient Education  2020 [employer].

## 2020-06-27 NOTE — Progress Notes (Signed)
Patient Care Team: Dortha Kern, MD as PCP - General (Family Medicine)   HPI  Adam Barajas is a 86 y.o. male Seen in follow-up for exercise associated dyspnea in context of permanent atrial fibrillation and a mild  nonischemic cardiomyopathy  He underwent cardiopulmonary stress testing. Maximum heart rate was 121     He has baseline left bundle branch block 2 interval hospitalizations for cellulits and apparently a diagnosis of vasculitis  Noted to be anemic  Dyspnea on exertion and edema, both variable and worse  No chest pain   Memory worse  Wife exhausted and in tears discussing care burdens   DATE TEST    6/17    myoview   EF 52 %  no ischemia  6/17    echo   EF 40-45 %  Mod LAE        Date Cr K Hgb  1/17 1.0 4.9    8/17    12.7  10/18 0.95 4.6 12.6  7/19   12.6  5/21 1.5 4.6 10.2<<8.2     Records and Results Reviewed   Past Medical History:  Diagnosis Date   BPH (benign prostatic hyperplasia)    Chronic atrial fibrillation (HCC)    Hyperlipidemia    Hypertension     History reviewed. No pertinent surgical history.  Current Outpatient Medications  Medication Sig Dispense Refill   acetaminophen (TYLENOL) 500 MG tablet Take by mouth.     albuterol (PROVENTIL HFA;VENTOLIN HFA) 108 (90 Base) MCG/ACT inhaler Inhale into the lungs every 6 (six) hours as needed for wheezing or shortness of breath.     apixaban (ELIQUIS) 5 MG TABS tablet Take by mouth.     atorvastatin (LIPITOR) 40 MG tablet Take 40 mg by mouth daily.     carvedilol (COREG) 6.25 MG tablet Take 1/2 tablet (3.125 mg) by mouth twice daily     cyclobenzaprine (FLEXERIL) 5 MG tablet Take 5 mg by mouth 3 (three) times daily as needed for muscle spasms.     furosemide (LASIX) 40 MG tablet Take 40 mg by mouth daily.     gabapentin (NEURONTIN) 100 MG capsule Take by mouth.     losartan (COZAAR) 25 MG tablet Take 25 mg by mouth daily.     memantine (NAMENDA) 10 MG tablet Take  by mouth.     tamsulosin (FLOMAX) 0.4 MG CAPS capsule Take 0.4 mg by mouth.     No current facility-administered medications for this visit.    Allergies  Allergen Reactions   Amoxicillin-Pot Clavulanate Hives   Poison Ivy Extract       Review of Systems negative except from HPI and PMH  Physical Exam BP 102/60 (BP Location: Left Arm, Patient Position: Sitting, Cuff Size: Normal)    Pulse (!) 48    Ht 5' 7.5" (1.715 m)    Wt 136 lb 12.8 oz (62.1 kg)    SpO2 98%    BMI 21.11 kg/m  Well developed and nourished in no acute distress HENT normal Neck supple with JVP-8-10 +HJR Carotids brisk and full without bruits Clear Irregularly irregular rate and rhythm with controlled ventricular response, no murmurs or gallops Abd-soft with active BS without hepatomegaly No Clubbing cyanosis 2+ edema Skin-warm and dry A & Oriented  Grossly normal sensory and motor function   ECG afib @ 48 -/13/47   Assessment and  Plan  Left bundle branch block  Cardiomyopathy-mild  Atrial fibrillation-permanent  Bradycardia  Hypertension  Dyspnea/CHF chronic class 3  Dementia  Anemia  End of life discussion  Renal insuff grade 3    Dyspnea is major issue, and likely multifactorial--with anemia LV function ,volume  Overload and bradycardia contributing  Will 1) check Echo 2) HgB and iron studies 3) BNP 4) stop carvedilol  Age and weight now inform Apixoban dosing reduction; while is weight is borderline, his Cr is up and his edema makes his dry weight < 132 almost certainly so we have discussed and agree to 5>>2.5  End of life discussion with palliative care needs and code status discussed,  Not yet ready to choose DNR   Will send note to Dr Quillian Quince and maybe she can help with palliative care support

## 2020-06-28 ENCOUNTER — Telehealth: Payer: Self-pay | Admitting: Internal Medicine

## 2020-06-28 LAB — IRON,TIBC AND FERRITIN PANEL
Ferritin: 518 ng/mL — ABNORMAL HIGH (ref 30–400)
Iron Saturation: 26 % (ref 15–55)
Iron: 60 ug/dL (ref 38–169)
Total Iron Binding Capacity: 234 ug/dL — ABNORMAL LOW (ref 250–450)
UIBC: 174 ug/dL (ref 111–343)

## 2020-06-28 LAB — CBC WITH DIFFERENTIAL/PLATELET
Basophils Absolute: 0.1 10*3/uL (ref 0.0–0.2)
Basos: 1 %
EOS (ABSOLUTE): 0.2 10*3/uL (ref 0.0–0.4)
Eos: 3 %
Hematocrit: 28.5 % — ABNORMAL LOW (ref 37.5–51.0)
Hemoglobin: 9.3 g/dL — ABNORMAL LOW (ref 13.0–17.7)
Immature Grans (Abs): 0 10*3/uL (ref 0.0–0.1)
Immature Granulocytes: 0 %
Lymphocytes Absolute: 1.4 10*3/uL (ref 0.7–3.1)
Lymphs: 22 %
MCH: 30.7 pg (ref 26.6–33.0)
MCHC: 32.6 g/dL (ref 31.5–35.7)
MCV: 94 fL (ref 79–97)
Monocytes Absolute: 1.1 10*3/uL — ABNORMAL HIGH (ref 0.1–0.9)
Monocytes: 16 %
Neutrophils Absolute: 3.7 10*3/uL (ref 1.4–7.0)
Neutrophils: 58 %
Platelets: 186 10*3/uL (ref 150–450)
RBC: 3.03 x10E6/uL — ABNORMAL LOW (ref 4.14–5.80)
RDW: 13.2 % (ref 11.6–15.4)
WBC: 6.5 10*3/uL (ref 3.4–10.8)

## 2020-06-28 LAB — BRAIN NATRIURETIC PEPTIDE: BNP: 861.8 pg/mL — ABNORMAL HIGH (ref 0.0–100.0)

## 2020-06-28 MED ORDER — APIXABAN 2.5 MG PO TABS
2.5000 mg | ORAL_TABLET | Freq: Two times a day (BID) | ORAL | 3 refills | Status: DC
Start: 2020-06-28 — End: 2020-08-10

## 2020-06-28 NOTE — Telephone Encounter (Signed)
The patient was seen in clinic yesterday with Dr. Graciela Husbands. His dose of eliquis was decreased to 2.5 mg BID. The patient and his wife requested at the time to please send his updated RX to [facility]. Per the patient's wife, she was also going to work on applying for patient assistance for this drug. She confirmed she had the assistance forms in hand.  She called back today requesting this also be sent to the local pharmacy to help reach the required out of pocket cost needed prior to being able to obtain assistance.  RX for eliquis 2.5 mg BID sent to the local pharmacy per her request.

## 2020-06-28 NOTE — Telephone Encounter (Signed)
Patient spouse calling  Patient would like to know if we can send in some Eliquis to local pharmacy ([facility] in [place])  Patient states if we send in $30 worth of pills to local pharmacy she can work on getting medication for free Any questions or concerns, please call

## 2020-07-20 ENCOUNTER — Other Ambulatory Visit: Payer: Self-pay

## 2020-07-20 ENCOUNTER — Ambulatory Visit (INDEPENDENT_AMBULATORY_CARE_PROVIDER_SITE_OTHER): Payer: Medicare Other

## 2020-07-20 DIAGNOSIS — R0600 Dyspnea, unspecified: Secondary | ICD-10-CM | POA: Diagnosis not present

## 2020-07-20 DIAGNOSIS — R0609 Other forms of dyspnea: Secondary | ICD-10-CM

## 2020-07-20 LAB — ECHOCARDIOGRAM COMPLETE
Area-P 1/2: 4.57 cm2
MV M vel: 4.82 m/s
MV Peak grad: 92.9 mmHg
S' Lateral: 3.9 cm
Single Plane A4C EF: 42.4 %

## 2020-08-01 ENCOUNTER — Telehealth: Payer: Self-pay | Admitting: Internal Medicine

## 2020-08-01 NOTE — Telephone Encounter (Signed)
Patient spouse [name] calling Would like to know ECHO results  Please call to discuss

## 2020-08-01 NOTE — Telephone Encounter (Signed)
To Dr. Graciela Husbands to review the patient's echo done 07/20/20. This is currently in Dr. Odessa Fleming in basket to review.

## 2020-08-01 NOTE — Telephone Encounter (Signed)
Please call [phone]

## 2020-08-02 NOTE — Telephone Encounter (Signed)
Duke Salvia, MD  08/01/2020 10:03 PM EDT     Please Inform Patient Echo showed fairly stable heart muscle function  Elevated pressure in the lungs Nothing specific to do for these things

## 2020-08-02 NOTE — Telephone Encounter (Signed)
Attempted to call the patient at his home #- no answer. I called and spoke with Mrs. Synder at [phone] (ok per DPR) and reviewed the patient's echo results with her.   She voices understanding. She requested a copy of the report be sent to Dr. Quillian Quince & a hard copy mailed to her.  This has been done.

## 2020-08-10 ENCOUNTER — Telehealth: Payer: Self-pay | Admitting: Internal Medicine

## 2020-08-10 MED ORDER — APIXABAN 2.5 MG PO TABS
2.5000 mg | ORAL_TABLET | Freq: Two times a day (BID) | ORAL | 3 refills | Status: DC
Start: 2020-08-10 — End: 2021-08-14

## 2020-08-10 NOTE — Telephone Encounter (Signed)
I spoke with the patient's wife regarding eliquis patient assistance.  Per Ms. Anne Hahn, they had been working on the the patient getting assistance for a while. She advised Dr. Graciela Husbands was not the original prescribing physician for this and that [facility] would not fill the RX until an updated physician portion was received from Dr. Graciela Husbands.   I advised Mrs. Synder I had received the paperwork this morning and had Dr. Graciela Husbands sign the physician portion along with a RX for the Eliquis. She is aware I will fax this in this afternoon as they have already completed the patient portion.   Mrs. Synder voices understanding and was appreciative for the call back.    RX for eliquis and completed physician portion of the [facility]- PA application was faxed to [phone].  Confirmation received.   The paperwork has been placed in the patient assistance file cabinet.

## 2020-08-10 NOTE — Telephone Encounter (Signed)
Please call regarding paperwork for Eliquis.

## 2020-12-28 ENCOUNTER — Ambulatory Visit (INDEPENDENT_AMBULATORY_CARE_PROVIDER_SITE_OTHER): Payer: Medicare Other | Admitting: Internal Medicine

## 2020-12-28 ENCOUNTER — Other Ambulatory Visit: Payer: Self-pay

## 2020-12-28 ENCOUNTER — Encounter: Payer: Self-pay | Admitting: Internal Medicine

## 2020-12-28 VITALS — BP 116/60 | HR 63 | Ht 67.5 in | Wt 137.6 lb

## 2020-12-28 DIAGNOSIS — I4821 Permanent atrial fibrillation: Secondary | ICD-10-CM

## 2020-12-28 DIAGNOSIS — I10 Essential (primary) hypertension: Secondary | ICD-10-CM

## 2020-12-28 DIAGNOSIS — I429 Cardiomyopathy, unspecified: Secondary | ICD-10-CM

## 2020-12-28 DIAGNOSIS — Z79899 Other long term (current) drug therapy: Secondary | ICD-10-CM

## 2020-12-28 DIAGNOSIS — I447 Left bundle-branch block, unspecified: Secondary | ICD-10-CM

## 2020-12-28 DIAGNOSIS — D649 Anemia, unspecified: Secondary | ICD-10-CM

## 2020-12-28 NOTE — Progress Notes (Signed)
Patient Care Team: Dortha Kern, MD as PCP - General (Family Medicine)   HPI  Adam Barajas is a 87 y.o. male Seen in follow-up for exercise associated dyspnea in context of permanent atrial fibrillation and a mild  nonischemic cardiomyopathy  He underwent cardiopulmonary stress testing. Maximum heart rate was 121     He has baseline left bundle branch block 2 interval hospitalizations for cellulits and apparently a diagnosis of vasculitis   The patient denies chest pain, nocturnal dyspnea, orthopnea or peripheral edema.  There have been no palpitations, lightheadedness or syncope.    Dyspnea continues  Anemia  On iron replacement although iron studies were inconsistent with iron deficiency.  Has been Noted to be anemic in the past and indeed saw at [facility] in 2013.  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  There thought it was more consistent with renal insufficiency as his creatinine that was 1.6 or so  Memory worse  Wife exhausted   DATE TEST EF   6/17    myoview  52 %  no ischemia  6/17 echo  40-45 %  Mod LAE  9/21 Echo  35-40% PA pres// RV dysfun-mild RAE severe/LAE mod   Date Cr K Hgb  1/17 1.0 4.9    8/17    12.7  10/18 0.95 4.6 12.6  7/19   12.6  5/21 1.5 4.6 10.2<<8.2  8/21   9.3     Records and Results Reviewed   Past Medical History:  Diagnosis Date  . BPH (benign prostatic hyperplasia)   . Chronic atrial fibrillation (HCC)   . Hyperlipidemia   . Hypertension     No past surgical history on file.  Current Outpatient Medications  Medication Sig Dispense Refill  . acetaminophen (TYLENOL) 500 MG tablet Take 500 mg by mouth as needed.    Marland Kitchen apixaban (ELIQUIS) 2.5 MG TABS tablet Take 1 tablet (2.5 mg total) by mouth 2 (two) times daily. 180 tablet 3  . atorvastatin (LIPITOR) 40 MG tablet Take 40 mg by mouth daily.    . furosemide (LASIX) 40 MG tablet Take 40 mg by mouth daily.    Marland Kitchen gabapentin (NEURONTIN) 100 MG capsule Take by mouth.    . losartan  (COZAAR) 25 MG tablet Take 25 mg by mouth daily.    . memantine (NAMENDA) 10 MG tablet Take by mouth.    . tamsulosin (FLOMAX) 0.4 MG CAPS capsule Take 0.4 mg by mouth.     No current facility-administered medications for this visit.    Allergies  Allergen Reactions  . Amoxicillin-Pot Clavulanate Hives  . Poison Ivy Extract       Review of Systems negative except from HPI and PMH  Physical Exam BP 116/60   Pulse 63   Ht 5' 7.5" (1.715 m)   Wt 137 lb 9.6 oz (62.4 kg)   SpO2 96%   BMI 21.23 kg/m  Well developed and nourished in no acute distress HENT normal Neck supple   Carotids brisk and full without bruits Clear Irregularly irregular rate and rhythm with controlled ventricular response, no murmurs or gallops Abd-soft with active BS without hepatomegaly No Clubbing cyanosis edema Skin-warm and dry A & Oriented  Grossly normal sensory and motor function  ECG:AF  @ 63            Intervals  /13/45/    Axis 94     Assessment and  Plan  IVCD  Cardiomyopathy-mild  Atrial fibrillation-permanent  Bradycardia  Hypertension   Dyspnea/CHF chronic class 3  Dementia  Anemia  End of life discussion  Renal insuff grade 3    Dyspnea somewhat better.  Has moderate cardiomyopathy.  Prior to addressing it however,i would like to review his anemia-- will recheck hemoglobin and if remains depressed will refer to hematology.  His blood pressure is on the somewhat low side as well as his heart rate.  We will continue to hold his carvedilol.  Continue his losartan.  Recommend again palliative care support.

## 2020-12-28 NOTE — Patient Instructions (Signed)
Medication Instructions:  - Your physician recommends that you continue on your current medications as directed. Please refer to the Current Medication list given to you today.  *If you need a refill on your cardiac medications before your next appointment, please call your pharmacy*   Lab Work: - Your physician recommends that you have lab work today: BMP/ CBC  If you have labs (blood work) drawn today and your tests are completely normal, you will receive your results only by: Marland Kitchen MyChart Message (if you have MyChart) OR . A paper copy in the mail If you have any lab test that is abnormal or we need to change your treatment, we will call you to review the results.   Testing/Procedures: - You have been referred to : Hematology at the [facility]  - they should reach out to you directly to schedule an appointment    Follow-Up: At [facility], you and your health needs are our priority.  As part of our continuing mission to provide you with exceptional heart care, we have created designated Provider Care Teams.  These Care Teams include your primary Cardiologist (physician) and Advanced Practice Providers (APPs -  Physician Assistants and Nurse Practitioners) who all work together to provide you with the care you need, when you need it.  We recommend signing up for the patient portal called "MyChart".  Sign up information is provided on this After Visit Summary.  MyChart is used to connect with patients for Virtual Visits (Telemedicine).  Patients are able to view lab/test results, encounter notes, upcoming appointments, etc.  Non-urgent messages can be sent to your provider as well.   To learn more about what you can do with MyChart, go to [email or web address].    Your next appointment:   3 month(s)  The format for your next appointment:   In Person  Provider:   Sherryl Manges, MD   Other Instructions n/a

## 2020-12-29 LAB — CBC WITH DIFFERENTIAL/PLATELET
Basophils Absolute: 0.1 10*3/uL (ref 0.0–0.2)
Basos: 0 %
EOS (ABSOLUTE): 0 10*3/uL (ref 0.0–0.4)
Eos: 0 %
Hematocrit: 30.4 % — ABNORMAL LOW (ref 37.5–51.0)
Hemoglobin: 10.6 g/dL — ABNORMAL LOW (ref 13.0–17.7)
Immature Grans (Abs): 0.2 10*3/uL — ABNORMAL HIGH (ref 0.0–0.1)
Immature Granulocytes: 1 %
Lymphocytes Absolute: 0.6 10*3/uL — ABNORMAL LOW (ref 0.7–3.1)
Lymphs: 3 %
MCH: 31.9 pg (ref 26.6–33.0)
MCHC: 34.9 g/dL (ref 31.5–35.7)
MCV: 92 fL (ref 79–97)
Monocytes Absolute: 1.4 10*3/uL — ABNORMAL HIGH (ref 0.1–0.9)
Monocytes: 7 %
Neutrophils Absolute: 17.9 10*3/uL — ABNORMAL HIGH (ref 1.4–7.0)
Neutrophils: 89 %
Platelets: 143 10*3/uL — ABNORMAL LOW (ref 150–450)
RBC: 3.32 x10E6/uL — ABNORMAL LOW (ref 4.14–5.80)
RDW: 12 % (ref 11.6–15.4)
WBC: 20.3 10*3/uL (ref 3.4–10.8)

## 2020-12-29 LAB — BASIC METABOLIC PANEL
BUN/Creatinine Ratio: 21 (ref 10–24)
BUN: 52 mg/dL — ABNORMAL HIGH (ref 8–27)
CO2: 19 mmol/L — ABNORMAL LOW (ref 20–29)
Calcium: 9.2 mg/dL (ref 8.6–10.2)
Chloride: 100 mmol/L (ref 96–106)
Creatinine, Ser: 2.48 mg/dL — ABNORMAL HIGH (ref 0.76–1.27)
GFR calc Af Amer: 26 mL/min/{1.73_m2} — ABNORMAL LOW (ref >59)
GFR calc non Af Amer: 22 mL/min/{1.73_m2} — ABNORMAL LOW (ref >59)
Glucose: 123 mg/dL — ABNORMAL HIGH (ref 65–99)
Potassium: 4.5 mmol/L (ref 3.5–5.2)
Sodium: 137 mmol/L (ref 134–144)

## 2021-01-01 ENCOUNTER — Telehealth: Payer: Self-pay | Admitting: Internal Medicine

## 2021-01-01 DIAGNOSIS — M79605 Pain in left leg: Secondary | ICD-10-CM

## 2021-01-01 DIAGNOSIS — M7989 Other specified soft tissue disorders: Secondary | ICD-10-CM

## 2021-01-01 DIAGNOSIS — N289 Disorder of kidney and ureter, unspecified: Secondary | ICD-10-CM

## 2021-01-01 NOTE — Telephone Encounter (Signed)
I spoke with the patient's wife, [name] (ok per DPR), regarding his lab results and Dr. Odessa Fleming recommendations to :  1) STOP losartan 2) STOP lasix 3) repeat a BMP in 1 week 4) follow up with Dr. Quillian Quince regarding elevated white count  [name] verbalizes understanding of these recommendations as stated above.  She then advised the she noticed the patient having unilateral left leg calf swelling yesterday. The leg is tight and shiny, but not red or hot to touch. Per the patient, this area is a little "tender."  [name] advised it resembles when he had cellulits before. I have advised her the other concern would be a DVT. She is aware I will review this with Dr. Graciela Husbands tomorrow and find out if a venous US is warranted.  [name] voices understanding and is agreeable.  BMP order placed.

## 2021-01-01 NOTE — Telephone Encounter (Signed)
Duke Salvia, MD  12/29/2020 8:03 PM EST      Please Inform Patient that labs are VERY ABNORMAL The kidney function is worse so wojld have his stop his losartan and hold his furosemide we will need to repeat the labs next week  Thanks   Jarvis Newcomer, RN  12/29/2020 10:03 AM EST      Per Dr. Graciela Husbands "thanks lisa Hgb is better and WBC is not  he can followup with PCP thanks" Results fwd to the patients pcp.

## 2021-01-03 ENCOUNTER — Telehealth: Payer: Self-pay

## 2021-01-03 NOTE — Telephone Encounter (Signed)
I spoke with the patient's wife. See phone note opened from yesterday.  Closing this encounter.

## 2021-01-03 NOTE — Telephone Encounter (Signed)
Patients wife calling - Wanting to know information about an ultrasound of his Leg.  Patient has been put on doxycycline for his leg - possible cellulitis.     Please call [phone] - Patient wife.

## 2021-01-03 NOTE — Telephone Encounter (Signed)
Verbally reviewed with Dr. Graciela Husbands, the patient's left lower extremity swelling. Per Dr. Graciela Husbands- the patient should have a lower extremity venous US to rule out DVT.  Per Secure chat message with Jillyn Hidden, ultrasound tech, he could work the patient in tomorrow afternoon or Friday afternoon- the patient would need to arrive at 4:30 pm, but may have to wait a little bit.  I have attempted to call Mrs. Synder- no answer. I left a message to please call back.

## 2021-01-03 NOTE — Telephone Encounter (Signed)
Heather °Thanks SK   °

## 2021-01-03 NOTE — Telephone Encounter (Signed)
I spoke with the patient's wife.  She advised she did not get my message from earlier, but an additional phone note was open today where she called back.  I have advised Mrs. Synder that Dr. Graciela Husbands would like to get an ultrasound of the patient's leg to rule out a DVT.  Per Mrs. Anne Hahn, they did see Dr. Quillian Quince in the office yesterday and she felt this may be related to cellulitis- the patient was started on doxycylcine. Per Mrs. Synder, the leg is red today and traveling up to the knee.  She is agreeable with the patient also having a lower extremity venous ultrasound. I have advised her that our Echo tech can add the patient on tomorrow.  He recommended the patient arrive at 4:30 pm, but they are aware they may have to wait for a bit.  Mrs. Synder voices understanding and is aware to come to our office to have this completed.

## 2021-01-04 ENCOUNTER — Other Ambulatory Visit: Payer: Self-pay

## 2021-01-04 ENCOUNTER — Ambulatory Visit (INDEPENDENT_AMBULATORY_CARE_PROVIDER_SITE_OTHER): Payer: Medicare Other

## 2021-01-04 DIAGNOSIS — M7989 Other specified soft tissue disorders: Secondary | ICD-10-CM

## 2021-01-04 DIAGNOSIS — M79605 Pain in left leg: Secondary | ICD-10-CM

## 2021-01-05 ENCOUNTER — Telehealth: Payer: Self-pay | Admitting: Internal Medicine

## 2021-01-05 NOTE — Telephone Encounter (Signed)
Reviewed with patients wife that results are pending provider review. She verbalized understanding with no further questions at this time.

## 2021-01-05 NOTE — Telephone Encounter (Signed)
Patient wife calling to review venous results.

## 2021-01-09 ENCOUNTER — Telehealth: Payer: Self-pay | Admitting: Internal Medicine

## 2021-01-09 ENCOUNTER — Other Ambulatory Visit: Payer: Self-pay

## 2021-01-09 DIAGNOSIS — D649 Anemia, unspecified: Secondary | ICD-10-CM

## 2021-01-09 NOTE — Telephone Encounter (Signed)
  We can add the BMP to labs tomorrow and let you know when the results are available.  Rosey Bath, MD

## 2021-01-09 NOTE — Telephone Encounter (Signed)
I called and spoke with the patient's wife. He is due to have a follow up BMP for Dr. Graciela Husbands this week to reassess his renal function after being off losartan and lasix.  Per Mrs. Anne Hahn, the patient is due to have lab work tomorrow for hematology in [place] and is trying to see if his BMP can be drawn there.  I advised that I see an appointment for lab work, but am unsure what all is being drawn.  The patient is also scheduled to see Dr. Merlene Pulling tomorrow as well at 1:45 pm.  I advised Mrs. Anne Hahn that I will reach out to Dr. Merlene Pulling to see if she is willing to draw a BMP for Dr. Graciela Husbands tomorrow to save him an additional stick.  She is aware I will reach back out to her once I hear back from Dr. Merlene Pulling.  Mrs. Anne Hahn was very appreciative for the call back and voices understanding.

## 2021-01-09 NOTE — Telephone Encounter (Signed)
I spoke with the patient's wife and advised her Dr. Merlene Pulling has been gracious enough to agree to add on the [facility] tomorrow that Dr. Graciela Husbands needs, when the patient's labs are drawn for her.  Mr. Anne Hahn was very appreciative and voices understanding.

## 2021-01-09 NOTE — Telephone Encounter (Signed)
Patient wife wants to discuss changing order for labs to [facility] site.  Patient will be there for another appt tomorrow.     Patient also wants to discuss meds that were cancelled but decline to provider further information.

## 2021-01-09 NOTE — Progress Notes (Signed)
[facility]  [address], [address] [place], [place] [address] Phone: [phone]  Fax: [phone]   Clinic Day:  01/10/2021  Referring physician: Duke Salvia, MD  Chief Complaint: Adam Barajas is a 87 y.o. male with anemia who is referred in consultation by Dr. Sherryl Manges for assessment and management.   HPI: The patient was diagnosed with MDS at the [facility] in [place]. He declined treatment and opted to transfer his care to [facility].  The patient was seen by Dr. Ciro Backer ([facility]) in 2011 and was diagnosed with anemia secondary to chronic renal insufficiency. His creatinine at that time ranged from 1.6-1.7. His hemoglobin ranged from 12-13 and his ferritin was 500-700. There was no evidence of MDS on lab testing.  The patient saw Dr. Girard Cooter ([facility]) on 05/25/2012. He reported fatigue, intentional weight loss, and easy bruising. Hematocrit was 37.0, hemoglobin 12.6, MCV 93.0, platelets 183,000, WBC 6,800. Monocyte count was 1,000. Ferritin was 752 with an iron saturation of 44% and a TIBC of 301. Flow cytometry showed no discrete population of blasts and no monoclonal B-cell population.  The patient saw Dr. Oretha Milch ([facility]) on 09/03/2012 and 11/26/2012.  He had mild monocytosis and without symptoms.  Smear revealed no atypical cells.  Creatinine was slightly worse.He was instructed to discontinue enalapril and ibuprofen.  The patient established care with Dr. Luevenia Maxin ([facility]) on 07/04/2015.  Monocytosis was stable.  Anemia was mild without need for transfusion or Procrit.  Recommendations were to refer back to hematology if monocyte count was > 1500.  The patient saw Dr. Graciela Husbands on 12/28/2020 for a follow-up regarding dyspnea in the context of permanent atrial fibrillation and mild nonischemic cardiomyopathy. Before addressing the cardiomyopathy, Dr. Graciela Husbands wanted to check on his anemia. He  was on oral iron. Hematocrit was 30.4, hemoglobin 10.6, MCV 92.0, platelets 143,000, WBC 20,300. Creatinine was 2.48 (CrCl 22 ml/min). Losartan and Lasix were discontinued.  The patient was referred to hematology.  Labs followed: 08/06/2007:  Hematocrit 39.0, hemoglobin 13.2, MCV 93.0, platelets 349,000, WBC   9,200. Ferritin 783. Iron saturation 44%. TIBC 295. B12    785, folate >20.0. 01/12/2008:  Hematocrit 38.0, hemoglobin 13.7, MCV 95.0, platelets 213,000, WBC   7,700. Ferritin 502.                                                    B12 1,088, folate >20.0. 06/20/2009:  Hematocrit 36.0, hemoglobin 12.3, MCV 95.0, platelets 194,000, WBC   7,000. Ferritin 664                                   TIBC 312. 03/05/2010:  Hematocrit 37.0, hemoglobin 12.5, MCV 92.0, platelets 187,000, WBC   6,700. Ferritin 591.                                  TIBC 281. 09/14/2014:  Hematocrit 39.0, hemoglobin 13.4, MCV 94.0, platelets 205,000, WBC   7,100. 07/01/2016:  Hematocrit 37.0, hemoglobin 12.7, MCV 94.0, platelets 182,000, WBC   7,400. 08/28/2017:  Hematocrit 37.9, hemoglobin 12.6, MCV 96.2, platelets 193,000, WBC   7,900. 05/13/2018:  Hematocrit 38.0, hemoglobin 12.6,  MCV 95.0, platelets 202,000, WBC   8,200. 06/27/2020:  Hematocrit 28.5, hemoglobin   9.3, MCV 94.0, platelets 186,000, WBC   6,500. Ferritin 518. Iron saturation 26%. TIBC 234. 12/28/2020:  Hematocrit 30.4, hemoglobin 10.6, MCV 92.0, platelets 143,000, WBC 20,300. Monocyte count 1400.  SPEP was normal on 03/17/2008 and 06/20/2009. Hemochromatosis assay was negative on 07/11/2009.  Creatinine was 1.4 on 12/04/2019.  The patient has a history of melanoma in situ on his left upper back, basal cell carcinoma of his right cheek, and squamous cell carcinoma on his left forehead. He sees Dr. Lorine Bears for yearly skin checks.  Symptomatically, he has been fine. He lost weight and got down to 130 lbs, but has gained some of it back. He bruises easy on  Eliquis. He has a retina problem. His finger joints hurt. His balance is poor but he denies falls.  The patient has had left leg cellulitis since 12/28/2020. He is taking doxycycline 100 mg BID and Keflex 500 mg TID. He has some redness and swelling on his right leg. He denies fever. He had cellulitis a year ago and had to be hospitalized twice.  The patient denies fevers, sweats, headaches, runny nose, sore throat, cough, shortness of breath, chest pain, palpitations, nausea, vomiting, diarrhea, reflux, urinary symptoms, numbness, weakness, and bleeding of any kind.  The patient eats well. He eats salmon and chicken. He eats less than he used to and does not drink a lot of fluids. He drinks protein shakes. He only really eats when his wife is around to give him food, which is around 5 days per week. He takes oral iron once daily.  He has not been on prednisone in several years.  Colonoscopy on 08/18/2003 revealed diverticulosis.    The patient's wife, [name], provided the majority of his medical history.   Past Medical History:  Diagnosis Date  . BPH (benign prostatic hyperplasia)   . Chronic atrial fibrillation (HCC)   . Hyperlipidemia   . Hypertension     History reviewed. No pertinent surgical history.  Family History  Problem Relation Age of Onset  . Hyperlipidemia Father    His father had brain cancer.   Social History:  reports that he has never smoked. He has never used smokeless tobacco. He reports current alcohol use. He reports that he does not use drugs. He denies tobacco use. He drinks wine twice per week. He drinks 2 cans of beers per day. He denies exposure to radiation or toxins. 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 He lives in [place]. His wife is Comptroller. They have been together for 41 years. The patient is accompanied by [facility] today.  Allergies:  Allergies   Allergen Reactions  . Amoxicillin-Pot Clavulanate Hives  . Poison Ivy Extract     Current Medications: Current Outpatient Medications  Medication Sig Dispense Refill  . acetaminophen (TYLENOL) 500 MG tablet Take 500 mg by mouth as needed.    Marland Kitchen apixaban (ELIQUIS) 2.5 MG TABS tablet Take 1 tablet (2.5 mg total) by mouth 2 (two) times daily. 180 tablet 3  . Ascorbic Acid (VITAMIN C) 1000 MG tablet Take 1,000 mg by mouth daily.    Marland Kitchen atorvastatin (LIPITOR) 40 MG tablet Take 40 mg by mouth daily.    Marland Kitchen gabapentin (NEURONTIN) 100 MG capsule Take 100 mg by mouth daily.    . memantine (NAMENDA) 10  MG tablet Take by mouth 2 (two) times daily.    . tamsulosin (FLOMAX) 0.4 MG CAPS capsule Take 0.4 mg by mouth daily.    Marland Kitchen. losartan (COZAAR) 25 MG tablet Take 25 mg by mouth daily. (Patient not taking: Reported on 01/10/2021)     No current facility-administered medications for this visit.    Review of Systems  Constitutional: Negative for chills, diaphoresis, fever, malaise/fatigue and weight loss.  HENT: Negative for congestion, ear discharge, ear pain, hearing loss, nosebleeds, sinus pain, sore throat and tinnitus.   Eyes: Negative for blurred vision.       Retina problem.  Respiratory: Negative for cough, hemoptysis, sputum production and shortness of breath.   Cardiovascular: Negative for chest pain, palpitations and leg swelling.  Gastrointestinal: Negative for abdominal pain, blood in stool, constipation, diarrhea, heartburn, melena, nausea and vomiting.  Genitourinary: Negative for dysuria, frequency, hematuria and urgency.  Musculoskeletal: Positive for joint pain (fingers). Negative for back pain, myalgias and neck pain.  Skin: Negative for itching and rash.       Left leg cellulitis.  Neurological: Negative for dizziness, tingling, sensory change, weakness and headaches.       Poor balance  Endo/Heme/Allergies: Bruises/bleeds easily (bruising on Eliquis).  Psychiatric/Behavioral:  Negative for depression and memory loss. The patient is not nervous/anxious and does not have insomnia.   All other systems reviewed and are negative.  Performance status (ECOG): 1-2  Vitals Blood pressure 140/66, pulse 60, temperature 98.2 F (36.8 C), temperature source Tympanic, weight 147 lb 11.3 oz (67 kg), SpO2 99 %.   Physical Exam Vitals and nursing note reviewed.  Constitutional:      General: He is not in acute distress.    Appearance: He is not diaphoretic.  HENT:     Head: Normocephalic and atraumatic.     Mouth/Throat:     Mouth: Mucous membranes are moist.     Pharynx: Oropharynx is clear.  Eyes:     General: No scleral icterus.    Extraocular Movements: Extraocular movements intact.     Conjunctiva/sclera: Conjunctivae normal.     Pupils: Pupils are equal, round, and reactive to light.     Comments: Small pupils.  Cardiovascular:     Rate and Rhythm: Normal rate. Rhythm irregular.     Heart sounds: Normal heart sounds. No murmur heard.   Pulmonary:     Effort: Pulmonary effort is normal. No respiratory distress.     Breath sounds: Normal breath sounds. No wheezing or rales.  Chest:     Chest wall: No tenderness.  Breasts:     Right: No axillary adenopathy or supraclavicular adenopathy.     Left: No axillary adenopathy or supraclavicular adenopathy.    Abdominal:     General: Bowel sounds are normal. There is no distension.     Palpations: Abdomen is soft. There is no mass.     Tenderness: There is no abdominal tenderness. There is no guarding or rebound.  Musculoskeletal:        General: Tenderness (BLE) present. No swelling. Normal range of motion.     Cervical back: Normal range of motion and neck supple.     Right lower leg: Edema present.     Left lower leg: Edema (dense edema) present.  Lymphadenopathy:     Head:     Right side of head: No preauricular, posterior auricular or occipital adenopathy.     Left side of head: No preauricular,  posterior auricular or occipital adenopathy.  Cervical: No cervical adenopathy.     Upper Body:     Right upper body: No supraclavicular or axillary adenopathy.     Left upper body: No supraclavicular or axillary adenopathy.     Lower Body: No right inguinal adenopathy. No left inguinal adenopathy.  Skin:    General: Skin is warm and dry.  Neurological:     Mental Status: He is alert and oriented to person, place, and time.  Psychiatric:        Behavior: Behavior normal.        Thought Content: Thought content normal.        Judgment: Judgment normal.    01/10/2021: Leg edema    Appointment on 01/10/2021  Component Date Value Ref Range Status  . Sodium 01/10/2021 140  135 - 145 mmol/L Final  . Potassium 01/10/2021 4.9  3.5 - 5.1 mmol/L Final  . Chloride 01/10/2021 108  98 - 111 mmol/L Final  . CO2 01/10/2021 21* 22 - 32 mmol/L Final  . Glucose, Bld 01/10/2021 103* 70 - 99 mg/dL Final   Glucose reference range applies only to samples taken after fasting for at least 8 hours.  . BUN 01/10/2021 37* 8 - 23 mg/dL Final  . Creatinine, Ser 01/10/2021 1.52* 0.61 - 1.24 mg/dL Final  . Calcium 69/48/5462 9.4  8.9 - 10.3 mg/dL Final  . Total Protein 01/10/2021 7.3  6.5 - 8.1 g/dL Final  . Albumin 70/35/0093 3.9  3.5 - 5.0 g/dL Final  . AST 81/82/9937 29  15 - 41 U/L Final  . ALT 01/10/2021 19  0 - 44 U/L Final  . Alkaline Phosphatase 01/10/2021 139* 38 - 126 U/L Final  . Total Bilirubin 01/10/2021 1.2  0.3 - 1.2 mg/dL Final  . GFR, Estimated 01/10/2021 44* >60 mL/min Final   Comment: (NOTE) Calculated using the CKD-EPI Creatinine Equation (2021)   . Anion gap 01/10/2021 11  5 - 15 Final   Performed at [facility], [address], [place], [place] [address]    Assessment:  Davy Westmoreland is a 87 y.o. male with chronic anemia.  He has a history of monocytosis since 2010. He eats well. He takes oral iron once daily.  Colonoscopy on 08/18/2003 revealed diverticulosis.     He has a history of an elevated ferritin (893-810) with an iron saturation of 26-44%.  Hemochromatosis assay was negative for C282Y and H63D on 07/11/2009.   He has chronic renal insuffiency.  Creatinine was 1.4 on 12/04/2019 and 2.48 on 12/28/2020.  Symptomatically, he denies any complaints.  He has chronic lower extremity changes.  Plan: 1.   Labs today:  CBC with diff, CMP, retic, ferritin, iron studies, B12, folate, TSH, SPEP, FLCA, flow cytometry. 2.   Peripheral blood for path review 3.   Normocytic anemia  He appears to have anemia of chronic disease (renal insufficiency).  Discuss work-up.  He denies any bleeding.    Diet appears good. 4.   Leukocytosis  Patient has a history of monocytosis.  Patient has had a lower extremity cellulitis.  Suspect etiology is reactive.  Peripheral blood for flow cytometry. 5.   RTC in 1 week for MD assessment, review of work-up and discussion and discussion regarding direction of therapy.  I discussed the assessment and treatment plan with the patient.  The patient was provided an opportunity to ask questions and all were answered.  The patient agreed with the plan and demonstrated an understanding of the instructions.  The  patient was advised to call back if the symptoms worsen or if the condition fails to improve as anticipated.  I provided 32 minutes of face-to-face time during this this encounter and > 50% was spent counseling as documented under my assessment and plan. An additional 15-20 minutes were spent reviewing his chart (Epic and Care Everywhere) including notes, labs, and imaging studies.    Marwah Disbro C. Merlene Pulling, MD, PhD    01/10/2021, 4:08 PM  I, Danella Penton Tufford, am acting as Neurosurgeon for [employer]. Merlene Pulling, MD, PhD.  I, Momoko Slezak C. Merlene Pulling, MD, have reviewed the above documentation for accuracy and completeness, and I agree with the above.

## 2021-01-10 ENCOUNTER — Other Ambulatory Visit: Payer: Self-pay

## 2021-01-10 ENCOUNTER — Inpatient Hospital Stay: Payer: Medicare Other | Attending: Hematology and Oncology | Admitting: Hematology and Oncology

## 2021-01-10 ENCOUNTER — Inpatient Hospital Stay: Payer: Medicare Other

## 2021-01-10 ENCOUNTER — Encounter: Payer: Self-pay | Admitting: Hematology and Oncology

## 2021-01-10 VITALS — BP 140/66 | HR 60 | Temp 98.2°F | Wt 147.7 lb

## 2021-01-10 DIAGNOSIS — N189 Chronic kidney disease, unspecified: Secondary | ICD-10-CM | POA: Insufficient documentation

## 2021-01-10 DIAGNOSIS — D72829 Elevated white blood cell count, unspecified: Secondary | ICD-10-CM

## 2021-01-10 DIAGNOSIS — D649 Anemia, unspecified: Secondary | ICD-10-CM

## 2021-01-10 DIAGNOSIS — Z7901 Long term (current) use of anticoagulants: Secondary | ICD-10-CM | POA: Diagnosis not present

## 2021-01-10 DIAGNOSIS — L03116 Cellulitis of left lower limb: Secondary | ICD-10-CM | POA: Diagnosis not present

## 2021-01-10 DIAGNOSIS — I4891 Unspecified atrial fibrillation: Secondary | ICD-10-CM | POA: Diagnosis not present

## 2021-01-10 DIAGNOSIS — Z79899 Other long term (current) drug therapy: Secondary | ICD-10-CM

## 2021-01-10 DIAGNOSIS — I428 Other cardiomyopathies: Secondary | ICD-10-CM | POA: Diagnosis not present

## 2021-01-10 DIAGNOSIS — Z7289 Other problems related to lifestyle: Secondary | ICD-10-CM | POA: Insufficient documentation

## 2021-01-10 DIAGNOSIS — N289 Disorder of kidney and ureter, unspecified: Secondary | ICD-10-CM

## 2021-01-10 LAB — COMPREHENSIVE METABOLIC PANEL
ALT: 19 U/L (ref 0–44)
AST: 29 U/L (ref 15–41)
Albumin: 3.9 g/dL (ref 3.5–5.0)
Alkaline Phosphatase: 139 U/L — ABNORMAL HIGH (ref 38–126)
Anion gap: 11 (ref 5–15)
BUN: 37 mg/dL — ABNORMAL HIGH (ref 8–23)
CO2: 21 mmol/L — ABNORMAL LOW (ref 22–32)
Calcium: 9.4 mg/dL (ref 8.9–10.3)
Chloride: 108 mmol/L (ref 98–111)
Creatinine, Ser: 1.52 mg/dL — ABNORMAL HIGH (ref 0.61–1.24)
GFR, Estimated: 44 mL/min — ABNORMAL LOW (ref >60)
Glucose, Bld: 103 mg/dL — ABNORMAL HIGH (ref 70–99)
Potassium: 4.9 mmol/L (ref 3.5–5.1)
Sodium: 140 mmol/L (ref 135–145)
Total Bilirubin: 1.2 mg/dL (ref 0.3–1.2)
Total Protein: 7.3 g/dL (ref 6.5–8.1)

## 2021-01-10 LAB — C-REACTIVE PROTEIN: CRP: 2.4 mg/dL — ABNORMAL HIGH (ref <1.0)

## 2021-01-10 LAB — FOLATE: Folate: 76 ng/mL (ref >5.9)

## 2021-01-10 LAB — RETICULOCYTES
Immature Retic Fract: 16.9 % — ABNORMAL HIGH (ref 2.3–15.9)
RBC.: 2.95 MIL/uL — ABNORMAL LOW (ref 4.22–5.81)
Retic Count, Absolute: 52 10*3/uL (ref 19.0–186.0)
Retic Ct Pct: 1.8 % (ref 0.4–3.1)

## 2021-01-10 LAB — SEDIMENTATION RATE: Sed Rate: 50 mm/hr — ABNORMAL HIGH (ref 0–20)

## 2021-01-10 LAB — VITAMIN B12: Vitamin B-12: 867 pg/mL (ref 180–914)

## 2021-01-10 LAB — FERRITIN: Ferritin: 285 ng/mL (ref 24–336)

## 2021-01-11 LAB — KAPPA/LAMBDA LIGHT CHAINS
Kappa free light chain: 66.3 mg/L — ABNORMAL HIGH (ref 3.3–19.4)
Kappa, lambda light chain ratio: 2.04 — ABNORMAL HIGH (ref 0.26–1.65)
Lambda free light chains: 32.5 mg/L — ABNORMAL HIGH (ref 5.7–26.3)

## 2021-01-11 LAB — PATHOLOGIST SMEAR REVIEW

## 2021-01-12 LAB — COMP PANEL: LEUKEMIA/LYMPHOMA

## 2021-01-13 LAB — MULTIPLE MYELOMA PANEL, SERUM
Albumin SerPl Elph-Mcnc: 3.7 g/dL (ref 2.9–4.4)
Albumin/Glob SerPl: 1.3 (ref 0.7–1.7)
Alpha 1: 0.3 g/dL (ref 0.0–0.4)
Alpha2 Glob SerPl Elph-Mcnc: 0.9 g/dL (ref 0.4–1.0)
B-Globulin SerPl Elph-Mcnc: 0.7 g/dL (ref 0.7–1.3)
Gamma Glob SerPl Elph-Mcnc: 1 g/dL (ref 0.4–1.8)
Globulin, Total: 2.9 g/dL (ref 2.2–3.9)
IgA: 232 mg/dL (ref 61–437)
IgG (Immunoglobin G), Serum: 999 mg/dL (ref 603–1613)
IgM (Immunoglobulin M), Srm: 48 mg/dL (ref 15–143)
Total Protein ELP: 6.6 g/dL (ref 6.0–8.5)

## 2021-01-16 ENCOUNTER — Telehealth: Payer: Self-pay | Admitting: Internal Medicine

## 2021-01-16 NOTE — Progress Notes (Signed)
[facility]  [address] [place], [place] [address] Phone: [phone]  Fax: [phone]   Clinic Day:  01/17/2021  Referring physician: Lynnell Jude, MD  Chief Complaint: Adam Barajas is a 87 y.o. male with anemia who is seen for review of work-up and discussion regarding direction of therapy.  HPI: The patient was last seen in the hematology clinic on 01/10/2021 for new patient assessment. He had previously been followed by [facility] hematology.  He was felt to have mild anemia of chronic renal disease.  At that time, he denied any B symptoms.  He was on Keflex for a left leg cellulitis.  Work-up revealed a creatinine of 1.52 (CrCl 44 ml/min) and alkaline phosphatase 139 (38-126). Ferritin was 285 with a sed rate of 50 and CRP 2.4. Reticulocyte count was 1.8%.  Vitamin B12 was 867 and folate 76.0.  SPEP revealed no monoclonal protein. Kappa free light chains were 66.3, lambda free light chains 32.5, and ratio 2.04 (0.26- 1.65).  Peripheral smear revealed a high normal WBC, with unremarkable differential and morphology. No circulating blasts were identified. There was normocytic anemia without significant anisocytosis. Platelet count and morphology were normal.  Flow cytometry revealed no significant immunophenotypic abnormality.  During the interim, he has been "good." His cellulitis has drastically improved. There is still a little bit of swelling, but much less. His last dose of Keflex is today. The patient stays cold. He gets short of breath with exertion.  The patient does not take folic acid. He does not eat cereal. He does take protein powder. The patient will check and see if the protein contains folic acid.  He is seeing Dr. Caryl Comes next Tuesday.   Past Medical History:  Diagnosis Date  . BPH (benign prostatic hyperplasia)   . Chronic atrial fibrillation ([place])   . Hyperlipidemia   . Hypertension     No past surgical history on  file.  Family History  Problem Relation Age of Onset  . Hyperlipidemia Father     Social History:  reports that he has never smoked. He has never used smokeless tobacco. He reports current alcohol use. He reports that he does not use drugs. He denies tobacco use. He drinks wine twice per week. He drinks 2 cans of beers per day. He denies exposure to radiation or toxins. 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 He lives in [place]. His wife is Veterinary surgeon. They have been together for 41 years. The patient is accompanied by [facility] today.  Allergies:  Allergies  Allergen Reactions  . Amoxicillin-Pot Clavulanate Hives  . Poison Ivy Extract     Current Medications: Current Outpatient Medications  Medication Sig Dispense Refill  . acetaminophen (TYLENOL) 500 MG tablet Take 500 mg by mouth as needed.    Marland Kitchen apixaban (ELIQUIS) 2.5 MG TABS tablet Take 1 tablet (2.5 mg total) by mouth 2 (two) times daily. 180 tablet 3  . Ascorbic Acid (VITAMIN C) 1000 MG tablet Take 1,000 mg by mouth daily.    Marland Kitchen atorvastatin (LIPITOR) 40 MG tablet Take 40 mg by mouth daily.    Marland Kitchen gabapentin (NEURONTIN) 100 MG capsule Take 100 mg by mouth daily.    . memantine (NAMENDA) 10 MG tablet Take by mouth 2 (two) times daily.    . tamsulosin (FLOMAX) 0.4 MG CAPS capsule Take 0.4 mg by mouth daily.  No current facility-administered medications for this visit.    Review of Systems  Constitutional: Negative for chills, diaphoresis, fever, malaise/fatigue and weight loss (up 2 lbs).  HENT: Negative for congestion, ear discharge, ear pain, hearing loss, nosebleeds, sinus pain, sore throat and tinnitus.   Eyes: Negative for blurred vision.  Respiratory: Positive for shortness of breath (on exertion). Negative for cough, hemoptysis and sputum production.   Cardiovascular: Negative for chest pain, palpitations and leg swelling.   Gastrointestinal: Negative for abdominal pain, blood in stool, constipation, diarrhea, heartburn, melena, nausea and vomiting.  Genitourinary: Negative for dysuria, frequency, hematuria and urgency.  Musculoskeletal: Negative for back pain, joint pain, myalgias and neck pain.  Skin: Negative for itching and rash.       Left leg cellulitis has dramatically improved  Neurological: Negative for dizziness, tingling, sensory change, weakness and headaches.       Poor balance  Endo/Heme/Allergies: Bruises/bleeds easily (bruising on Eliquis).       Stays cold  Psychiatric/Behavioral: Negative for depression and memory loss. The patient is not nervous/anxious and does not have insomnia.   All other systems reviewed and are negative.  Performance status (ECOG): 1-2  Vitals Blood pressure (!) 160/74, pulse 64, temperature (!) 97.2 F (36.2 C), temperature source Tympanic, resp. rate 18, weight 149 lb 14.6 oz (68 kg), SpO2 98 %.   Physical Exam Vitals and nursing note reviewed.  Constitutional:      General: He is not in acute distress.    Appearance: He is not diaphoretic.  Eyes:     General: No scleral icterus.    Conjunctiva/sclera: Conjunctivae normal.  Neurological:     Mental Status: He is alert and oriented to person, place, and time.  Psychiatric:        Behavior: Behavior normal.        Thought Content: Thought content normal.        Judgment: Judgment normal.    01/10/2021: Leg edema    No visits with results within 3 Day(s) from this visit.  Latest known visit with results is:  Appointment on 01/10/2021  Component Date Value Ref Range Status  . PATH INTERP XXX-IMP 01/10/2021 Comment   Final   No significant immunophenotypic abnormality detected, see comment  . ANNOTATION COMMENT IMP 01/10/2021 Comment   Corrected   Recommend clinical correlation and follow up as appropriate.  Marland Kitchen CLINICAL INFO 01/10/2021 Comment   Corrected   Comment: (NOTE) Anemia Accompanying CBC  dated 12/28/2020 shows: WBC count 20.3, Hgb 10.6, Neu 17.9, Lym 0.6, Mon 1.4, Plt 143K.   Marland Kitchen Specimen Type 01/10/2021 Comment   Final   Peripheral blood  . ASSESSMENT OF LEUKOCYTES 01/10/2021 Comment   Final   Comment: (NOTE) No monoclonal B cell population is detected. kappa:lambda ratio 1.3 There is no loss of, or aberrant expression of, the pan T cell antigens to suggest a neoplastic T cell process. CD4:CD8 ratio 3.0 No circulating blasts are detected. There is no immunophenotypic evidence of abnormal myeloid maturation. Rare monocytes show dim aberrant expression of CD56, a finding that can be seen in association with both reactive/activated processes as well as neoplastic processes. Analysis of the lymphocyte population shows: B cells 2%, T cells 79%, NK cells 19%.   . % Viable Cells 01/10/2021 Comment   Corrected   84%  . ANALYSIS AND GATING STRATEGY 01/10/2021 Comment   Final   8 color analysis with CD45/SSC gating  . IMMUNOPHENOTYPING STUDY 01/10/2021 Comment   Final  Comment: (NOTE) CD2       Normal         CD3       Normal CD4       Normal         CD5       Normal CD7       Normal         CD8       Normal CD10      Normal         CD11b     Normal CD13      Normal         CD14      Normal CD16      Normal         CD19      Normal CD20      Normal         CD33      Normal CD34      Normal         CD38      Normal CD45      Normal         CD56      See Text CD57      Normal         CD117     Normal HLA-DR    Normal         KAPPA     Normal LAMBDA    Normal         CD64      Normal   . PATHOLOGIST NAME 01/10/2021 Comment   Final   Lovett Sox, M.D.  . COMMENT: 01/10/2021 Comment   Corrected   Comment: (NOTE) Each antibody in this assay was utilized to assess for potential abnormalities of studied cell populations or to characterize identified abnormalities. This test was developed and its performance characteristics determined by [facility].  It has not been  cleared or approved by the U.S. Food and Drug Administration. The FDA has determined that such clearance or approval is not necessary. This test is used for clinical purposes.  It should not be regarded as investigational or for research. Performed At: -[facility] [address] [place], [place] [id number] Katina Degree MDPhD [id number] Performed At: [facility] [address] [place], [place] [id number] Katina Degree MDPhD [id number]   . Kappa free light chain 01/10/2021 66.3* 3.3 - 19.4 mg/L Final  . Lamda free light chains 01/10/2021 32.5* 5.7 - 26.3 mg/L Final  . Kappa, lamda light chain ratio 01/10/2021 2.04* 0.26 - 1.65 Final   Comment: (NOTE) Performed At: [facility] [place], [place] [id number] Rush Farmer MD [id number]   . Folate 01/10/2021 76.0  >5.9 ng/mL Final   Comment: RESULT CONFIRMED BY MANUAL DILUTION HNM Performed at [facility], [address] [place], [place] [address]   . Vitamin B-12 01/10/2021 867  180 - 914 pg/mL Final   Comment: (NOTE) This assay is not validated for testing neonatal or myeloproliferative syndrome specimens for Vitamin B12 levels. Performed at [facility], [place] [address], [place], [place] [address]   . Ferritin 01/10/2021 285  24 - 336 ng/mL Final   Performed at [facility], [address] [place], [place] [address]  . Path Review 01/10/2021 Blood smear is reviewed.   Final   Comment: The clinical history of chronic anemia, as well as reported history of MDS, is noted. High normal WBC,  with unremarkable differential and morphology. No circulating blasts identified. Normocytic anemia, without significant anisocytosis. Normal platelet count and morphology. The cause of the patient's anemia is unclear from morphologic review alone.  Potential secondary causes of chronic anemia should be considered, including nutritional deficiencies, medication/toxin exposure,  chronic illness (including chronic kidney disease), and/or autoimmune/rheumatologic disease. Clinical correlation recommended. Reviewed by Kathi Simpers, M.D. Performed at [facility], [address] [place], [place] [address]   . Sodium 01/10/2021 140  135 - 145 mmol/L Final  . Potassium 01/10/2021 4.9  3.5 - 5.1 mmol/L Final  . Chloride 01/10/2021 108  98 - 111 mmol/L Final  . CO2 01/10/2021 21* 22 - 32 mmol/L Final  . Glucose, Bld 01/10/2021 103* 70 - 99 mg/dL Final   Glucose reference range applies only to samples taken after fasting for at least 8 hours.  . BUN 01/10/2021 37* 8 - 23 mg/dL Final  . Creatinine, Ser 01/10/2021 1.52* 0.61 - 1.24 mg/dL Final  . Calcium 01/10/2021 9.4  8.9 - 10.3 mg/dL Final  . Total Protein 01/10/2021 7.3  6.5 - 8.1 g/dL Final  . Albumin 01/10/2021 3.9  3.5 - 5.0 g/dL Final  . AST 01/10/2021 29  15 - 41 U/L Final  . ALT 01/10/2021 19  0 - 44 U/L Final  . Alkaline Phosphatase 01/10/2021 139* 38 - 126 U/L Final  . Total Bilirubin 01/10/2021 1.2  0.3 - 1.2 mg/dL Final  . GFR, Estimated 01/10/2021 44* >60 mL/min Final   Comment: (NOTE) Calculated using the CKD-EPI Creatinine Equation (2021)   . Anion gap 01/10/2021 11  5 - 15 Final   Performed at [facility], [address], [place], [place] [address]  . Sed Rate 01/10/2021 50* 0 - 20 mm/hr Final   Performed at [facility], [address], [place], [place] [address]  . CRP 01/10/2021 2.4* <1.0 mg/dL Final   Performed at [facility] [address] [place], [place] [address]  . IgG (Immunoglobin G), Serum 01/10/2021 999  603 - 1,613 mg/dL Final  . IgA 01/10/2021 232  61 - 437 mg/dL Final  . IgM (Immunoglobulin M), Srm 01/10/2021 48  15 - 143 mg/dL Final  . Total Protein ELP 01/10/2021 6.6  6.0 - 8.5 g/dL Corrected  . Albumin SerPl Elph-Mcnc 01/10/2021 3.7  2.9 - 4.4 g/dL Corrected  . Alpha 1 01/10/2021 0.3  0.0 - 0.4 g/dL Corrected  . Alpha2 Glob SerPl  Elph-Mcnc 01/10/2021 0.9  0.4 - 1.0 g/dL Corrected  . B-Globulin SerPl Elph-Mcnc 01/10/2021 0.7  0.7 - 1.3 g/dL Corrected  . Gamma Glob SerPl Elph-Mcnc 01/10/2021 1.0  0.4 - 1.8 g/dL Corrected  . M Protein SerPl Elph-Mcnc 01/10/2021 Not Observed  Not Observed g/dL Corrected  . Globulin, Total 01/10/2021 2.9  2.2 - 3.9 g/dL Corrected  . Albumin/Glob SerPl 01/10/2021 1.3  0.7 - 1.7 Corrected  . IFE 1 01/10/2021 Comment   Corrected   Comment: (NOTE) The immunofixation pattern appears unremarkable. Evidence of monoclonal protein is not apparent.   . Please Note 01/10/2021 Comment   Corrected   Comment: (NOTE) Protein electrophoresis scan will follow via computer, mail, or courier delivery. Performed At: [facility] [place], [place] [id number] Rush Farmer MD QI:[id number]   . Retic Ct Pct 01/10/2021 1.8  0.4 - 3.1 % Final  . RBC. 01/10/2021 2.95* 4.22 - 5.81 MIL/uL Final  . Retic Count, Absolute 01/10/2021 52.0  19.0 -  186.0 K/uL Final  . Immature Retic Fract 01/10/2021 16.9* 2.3 - 15.9 % Final   Performed at [facility], [address] [place], [place] [address]    Assessment:  Adam Barajas is a 87 y.o. male with chronic anemia.  He has a history of monocytosis since 2010.  He eats well. He takes oral iron once daily.  Work-up on 01/10/2021 revealed a creatinine of 1.52 (CrCl 44 ml/min) and alkaline phosphatase 139 (38-126). Ferritin was 285 with a sed rate of 50 and CRP 2.4. Reticulocyte count was 1.8%.  Vitamin B12 was 867 and folate 76.0.  SPEP revealed no monoclonal protein. Kappa free light chains were 66.3, lambda free light chains 32.5, and ratio 2.04 (0.26- 1.65).    Peripheral smear revealed a high normal WBC, with unremarkable differential and morphology. No circulating blasts were identified. There was normocytic anemia without significant anisocytosis. Platelet count and morphology were normal.  Flow cytometry revealed no significant  immunophenotypic abnormality.  Colonoscopy on 08/18/2003 revealed diverticulosis.  He has a history of an elevated ferritin ([id number]) with an iron saturation of 26-44%.  Hemochromatosis assay was negative for C282Y and H63D on 07/11/2009.   He has chronic renal insuffiency.  Creatinine was 1.4 on 12/04/2019 and 2.48 on 12/28/2020.  Symptomatically, he feels "good." His cellulitis has drastically improved. His last dose of Keflex is today.  He feels cold.  He denies fever or chills.  Plan: 1.   Labs today:  CBC with diff, TSH, free T4. 2.   Normocytic anemia  He has an anemia of chronic disease.  No current evidence of bleeding.  Last colonoscopy 2004.  Iron stores likely falsely elevated secondary to to an acute phase reactantn (high sed rate and CRP). 3.  Leukocytosis, resolved  WBC 6500 with an [place] of 3900 today.  Peripheral smear and flow cytometry revealed no abnormality.  Leukocytosis likely reactive secondary to infection (resolved). 4.   RTC in 6 months for MD assessment and labs (CBC with diff, CMP, ferritin).  I discussed the assessment and treatment plan with the patient.  The patient was provided an opportunity to ask questions and all were answered.  The patient agreed with the plan and demonstrated an understanding of the instructions.  The patient was advised to call back if the symptoms worsen or if the condition fails to improve as anticipated.  I provided 16 minutes of face-to-face time during this this encounter and > 50% was spent counseling as documented under my assessment and plan.    Melissa C. Mike Gip, MD, PhD    01/17/2021, 2:58 PM  I, Mirian Mo Tufford, am acting as Education administrator for [employer]. Mike Gip, MD, PhD.  I, Melissa C. Mike Gip, MD, have reviewed the above documentation for accuracy and completeness, and I agree with the above.

## 2021-01-16 NOTE — Telephone Encounter (Signed)
Patient wife calling to check on restart date for recently held medications.

## 2021-01-17 ENCOUNTER — Other Ambulatory Visit: Payer: Self-pay

## 2021-01-17 ENCOUNTER — Inpatient Hospital Stay: Payer: Medicare Other

## 2021-01-17 ENCOUNTER — Inpatient Hospital Stay (HOSPITAL_BASED_OUTPATIENT_CLINIC_OR_DEPARTMENT_OTHER): Payer: Medicare Other | Admitting: Hematology and Oncology

## 2021-01-17 VITALS — BP 160/74 | HR 64 | Temp 97.2°F | Resp 18 | Wt 149.9 lb

## 2021-01-17 DIAGNOSIS — D72829 Elevated white blood cell count, unspecified: Secondary | ICD-10-CM

## 2021-01-17 DIAGNOSIS — D649 Anemia, unspecified: Secondary | ICD-10-CM

## 2021-01-17 DIAGNOSIS — R6889 Other general symptoms and signs: Secondary | ICD-10-CM | POA: Diagnosis not present

## 2021-01-17 LAB — CBC WITH DIFFERENTIAL/PLATELET
Abs Immature Granulocytes: 0.03 10*3/uL (ref 0.00–0.07)
Basophils Absolute: 0.1 10*3/uL (ref 0.0–0.1)
Basophils Relative: 1 %
Eosinophils Absolute: 0.1 10*3/uL (ref 0.0–0.5)
Eosinophils Relative: 2 %
HCT: 28.6 % — ABNORMAL LOW (ref 39.0–52.0)
Hemoglobin: 9.3 g/dL — ABNORMAL LOW (ref 13.0–17.0)
Immature Granulocytes: 1 %
Lymphocytes Relative: 22 %
Lymphs Abs: 1.5 10*3/uL (ref 0.7–4.0)
MCH: 31.5 pg (ref 26.0–34.0)
MCHC: 32.5 g/dL (ref 30.0–36.0)
MCV: 96.9 fL (ref 80.0–100.0)
Monocytes Absolute: 0.9 10*3/uL (ref 0.1–1.0)
Monocytes Relative: 14 %
Neutro Abs: 3.9 10*3/uL (ref 1.7–7.7)
Neutrophils Relative %: 60 %
Platelets: 228 10*3/uL (ref 150–400)
RBC: 2.95 MIL/uL — ABNORMAL LOW (ref 4.22–5.81)
RDW: 14.4 % (ref 11.5–15.5)
WBC: 6.5 10*3/uL (ref 4.0–10.5)
nRBC: 0 % (ref 0.0–0.2)

## 2021-01-17 LAB — TSH: TSH: 4.666 u[IU]/mL — ABNORMAL HIGH (ref 0.350–4.500)

## 2021-01-17 LAB — T4, FREE: Free T4: 1.44 ng/dL — ABNORMAL HIGH (ref 0.61–1.12)

## 2021-01-17 NOTE — Telephone Encounter (Signed)
I had verbally reviewed the patient's CMP results with Dr. Graciela Husbands prior to him leaving the country on 01/11/21.   12/28/20: BUN/ Creatinine - 52/2.48 - lasix & losartan were held starting 01/01/21 with orders received from Dr. Graciela Husbands to also repeat a BMP ~ 1 week later - CMET drawn on 01/10/21 with Dr. Merlene Pulling  3/9/22L BUN/ Creatinine- 37/1.52 - Verbal orders from Dr. Graciela Husbands to continue off of lasix & losartan with a follow up appointment in 2 weeks with an APP.  I have attempted to contact Mrs. Synder. No answer- I left a message to please call back.

## 2021-01-17 NOTE — Telephone Encounter (Signed)
I spoke with the patient's wife (ok per DPR).  I have advised her of Dr. Odessa Fleming recommendations to: - continue to hold lasix/ losartan - follow up with an APP next week (will be ~ 2 weeks)  Mrs. Synder voices understanding and is agreeable. They will be seeing Gillian Shields, NP at 1:30 pm  The patient's wife confirmed this appointment.  I have also made her aware that the [facility] is now open for them to come through.

## 2021-01-17 NOTE — Progress Notes (Signed)
Cellulitis in leg has improved per patient

## 2021-01-22 NOTE — Progress Notes (Addendum)
Office Visit    Patient Name: Adam Barajas Date of Encounter: 01/23/2021  PCP:  Dortha Kern, MD   [facility]  Cardiologist:  Dr. Graciela Husbands  Chief Complaint    Adam Barajas is a 87 y.o. male with a hx of dyspnea, permanent atrial fibrillation on Eliquis, nonischemic cardiomyopathy, LBBB, anemia, dementia, CKDIII presents today for follow up of renal function.   Past Medical History    Past Medical History:  Diagnosis Date  . BPH (benign prostatic hyperplasia)   . Chronic atrial fibrillation (HCC)   . Hyperlipidemia   . Hypertension    No past surgical history on file.  Allergies  Allergies  Allergen Reactions  . Amoxicillin-Pot Clavulanate Hives  . Poison Ivy Extract     History of Present Illness    Adam Barajas is a 87 y.o. male with a hx of dyspnea, permanent atrial fibrillation on Eliquis, nonischemic cardiomyopathy, LBBB, anemia, dementia, CKDIII. He was last seen by Dr. Graciela Husbands 12/28/20.Marland Kitchen  Echo 04/2016 with LVEF 45%. Subsequent stress test 04/2016 abnormal septal motion consistentw ith LBBB otherwise normal wall motion with no evidence of ischemia or infarction. Previous CPX testing 11/2016 with submaximal effort during stress.   Echocardiogram 07/20/20 with LVEF 35-40%, LV global hypokinesis, indeterminate LV diastolic parameters, RVSF mildly reduced with moderately elevate PASP, LA moderately dilated, RA severely dilated, mild-mod MR, RA pressure 8 mmHg.  Lab work 12/28/20 with BUN/creatinine 52/2.48 recommended to hold Lasix and Losartan. Repeat lab work 01/10/21 with BUN/creatinine 37/1.52. He has established with Dr. Merlene Pulling of hematology.   Labs 01/17/21 with TSH 4.666, T4 1.44.   Presents today for follow up with his daughter and wife.  Thorough review of his recent lab work including holding of Lasix and losartan.  Patient's wife notes he is having 8 loose BMs per day.  This has been a longstanding issue and encouraged to follow-up with  primary care provider.  His wife shares with me that she did stop his iron tablet today.  He drinks only 1 cup of coffee per day and drinks very little fluid.  We discussed that he is likely dehydrated which could have led to his decreased renal function as well as orthostatic lightheadedness that he is experiencing.  Denies chest pain, pressure, tightness.  Reports no shortness of breath at rest and tells me dyspnea on exertion is stable at his baseline.  He does have a congested cough which has been ongoing for some time.  No wheezing.  RLE edema has improved with no redness though still edematous.  His wife has been wrapping it at home with compression wrap.  EKGs/Labs/Other Studies Reviewed:   The following studies were reviewed today:  Echo 07/20/20 1. Left ventricular ejection fraction, by estimation, is 35 to 40%. The  left ventricle has moderately decreased function. The left ventricle  demonstrates global hypokinesis. Left ventricular diastolic parameters are  indeterminate.   2. Right ventricular systolic function is mildly reduced. The right  ventricular size is normal. There is moderately elevated pulmonary artery  systolic pressure.   3. Left atrial size was moderately dilated.   4. Right atrial size was severely dilated.   5. The mitral valve is normal in structure. Mild to moderate mitral valve  regurgitation.   6. Tricuspid valve regurgitation is moderate.   7. The aortic valve is normal in structure. Aortic valve regurgitation is  not visualized.   8. The inferior vena cava is normal in size with <50%  respiratory  variability, suggesting right atrial pressure of 8 mmHg.   EKG:  EKG is ordered today.  The ekg ordered today demonstrates atrial fibrillation 58 bpm with left axis deviation and overall low voltage.  No acute ST/T wave changes.  Recent Labs: 06/27/2020: BNP 861.8 01/10/2021: ALT 19; BUN 37; Creatinine, Ser 1.52; Potassium 4.9; Sodium 140 01/17/2021: Hemoglobin 9.3;  Platelets 228; TSH 4.666  Recent Lipid Panel No results found for: CHOL, TRIG, HDL, CHOLHDL, VLDL, LDLCALC, LDLDIRECT  Home Medications   Current Meds  Medication Sig  . acetaminophen (TYLENOL) 500 MG tablet Take 500 mg by mouth as needed.  Marland Kitchen apixaban (ELIQUIS) 2.5 MG TABS tablet Take 1 tablet (2.5 mg total) by mouth 2 (two) times daily.  . Ascorbic Acid (VITAMIN C) 1000 MG tablet Take 1,000 mg by mouth daily.  Marland Kitchen atorvastatin (LIPITOR) 40 MG tablet Take 40 mg by mouth daily.  Marland Kitchen gabapentin (NEURONTIN) 100 MG capsule Take 100 mg by mouth daily.  Marland Kitchen losartan (COZAAR) 25 MG tablet Take 0.5 tablets (12.5 mg total) by mouth daily.  . memantine (NAMENDA) 10 MG tablet Take by mouth 2 (two) times daily.  . tamsulosin (FLOMAX) 0.4 MG CAPS capsule Take 0.4 mg by mouth daily.     Review of Systems   Review of Systems  Constitutional: Negative for chills, fever and malaise/fatigue.  Cardiovascular: Positive for dyspnea on exertion. Negative for chest pain, irregular heartbeat, leg swelling, near-syncope, orthopnea, palpitations and syncope.  Respiratory: Negative for cough, shortness of breath and wheezing.   Gastrointestinal: Positive for diarrhea (8 loose stools per day). Negative for melena, nausea and vomiting.  Genitourinary: Negative for hematuria.  Neurological: Positive for light-headedness. Negative for dizziness and weakness.   All other systems reviewed and are otherwise negative except as noted above.  Physical Exam    VS:  BP 110/62 (BP Location: Left Arm, Patient Position: Sitting, Cuff Size: Normal)   Pulse 68   Ht 5\' 6"  (1.676 m)   Wt 155 lb (70.3 kg)   BMI 25.02 kg/m  , BMI Body mass index is 25.02 kg/m.  Wt Readings from Last 3 Encounters:  01/23/21 155 lb (70.3 kg)  01/17/21 149 lb 14.6 oz (68 kg)  01/10/21 147 lb 11.3 oz (67 kg)    GEN: Well nourished, well developed, in no acute distress. HEENT: normal. Neck: Supple, no JVD, carotid bruits, or masses. Cardiac:  Irregularly irregular, no murmurs, rubs, or gallops. No clubbing, cyanosis,.  Radials/DP/PT 2+ and equal bilaterally. RLE with 3+ pitting edema to ankle. LLE without edema. Respiratory:  Respirations regular and unlabored, diminished bases bilaterally, no adventitious lung sounds. GI: Soft, nontender, nondistended. MS: No deformity or atrophy. Skin: Warm and dry, no rash. Neuro:  Strength and sensation are intact. Psych: Normal affect.  Assessment & Plan    1. Cough -congested cough longstanding.  Lung sounds clear on exam however in setting of persistent cough we will plan for chest x-ray today.  Encouraged to utilize Mucinex for phlegm resolution  2. RBBB / bradycardia / permanent atrial fibrillation / chronic anticoagulation -continue to follow with Dr. 03/12/21.  Denies bleeding complications on Eliquis 2.5 mg twice daily-we will continue.  Dose appropriately reduced.  Rate controlled without AV nodal blocking agent able to edition due to relative hypotension.  3. LE edema / cellulitis -cellulitis is resolved after treatment with antibiotics.  Still with 3+ right ankle pitting edema.  Hesitant to add Lasix back due to loose stool, poor p.o. fluid intake,  recent AKI, orthostatic lightheadedness.  His wife will continue to assist him with wrapping and recommend compression as well as elevation.  I will call him early next week to check in on swelling and if still persistent may consider short trial of a few days of low-dose Lasix.  4. Nonischemic cardiomyopathy - NYHA II-III with dyspnea on exertion. Edema limited to RLE at site of previous cellulitis.  GDMT limited by hypotension and orthostatic lightheadedness.  Coreg, losartan, Lasix have previously been held.  We will resume losartan 12.5 mg daily today.  Future considerations include addition of low-dose spironolactone which may be better tolerated than Lasix in regards to blood pressure however will need to carefully monitor renal function and  potassium.  5. HTN -BP is low today likely in the setting of poor p.o. fluid intake.  Careful monitoring with reinitiation of low-dose losartan for heart failure GDMT.  6. Dementia -visit assisted by daughter and wife.  7. Anemia -recent hemoglobin stable at 9.3.  Continue to follow with Dr. Merlene Pulling.  We discussed monitoring of anemia at least every 6 months with CBC.  8. CKDIII -careful titration of diuretics and antihypertensives.  Avoid nephrotoxic agents.  Creatinine is back to his baseline of 1.5.  Plan for repeat BMP for monitoring at next follow-up  Disposition: Follow up in 3 week(s) with Dr. Graciela Husbands or APP with BMP at that time.  Signed, Alver Sorrow, NP 01/23/2021, 3:57 PM [facility]

## 2021-01-23 ENCOUNTER — Other Ambulatory Visit: Payer: Self-pay

## 2021-01-23 ENCOUNTER — Ambulatory Visit
Admission: RE | Admit: 2021-01-23 | Discharge: 2021-01-23 | Disposition: A | Discharge status: Home / Self Care | Payer: Medicare Other | Source: Ambulatory Visit | Attending: Family | Admitting: Family

## 2021-01-23 ENCOUNTER — Encounter: Payer: Self-pay | Admitting: Family

## 2021-01-23 ENCOUNTER — Ambulatory Visit (INDEPENDENT_AMBULATORY_CARE_PROVIDER_SITE_OTHER): Payer: Medicare Other | Admitting: Family

## 2021-01-23 VITALS — BP 110/62 | HR 68 | Ht 66.0 in | Wt 155.0 lb

## 2021-01-23 DIAGNOSIS — I428 Other cardiomyopathies: Secondary | ICD-10-CM | POA: Diagnosis not present

## 2021-01-23 DIAGNOSIS — N1832 Chronic kidney disease, stage 3b: Secondary | ICD-10-CM

## 2021-01-23 DIAGNOSIS — Z7901 Long term (current) use of anticoagulants: Secondary | ICD-10-CM

## 2021-01-23 DIAGNOSIS — R0609 Other forms of dyspnea: Secondary | ICD-10-CM

## 2021-01-23 DIAGNOSIS — R0600 Dyspnea, unspecified: Secondary | ICD-10-CM

## 2021-01-23 DIAGNOSIS — I4821 Permanent atrial fibrillation: Secondary | ICD-10-CM | POA: Diagnosis not present

## 2021-01-23 DIAGNOSIS — D649 Anemia, unspecified: Secondary | ICD-10-CM

## 2021-01-23 MED ORDER — LOSARTAN POTASSIUM 25 MG PO TABS: 12.5000 mg | ORAL_TABLET | Freq: Every day | ORAL | 3 refills | Status: DC

## 2021-01-23 NOTE — Patient Instructions (Addendum)
Medication Instructions:  Your physician has recommended you make the following change in your medication:   START Losartan 12.5mg  (half tablet) once daily  You can use over the counter Muccinex for congestion.  *If you need a refill on your cardiac medications before your next appointment, please call your pharmacy*   Lab Work: None ordered today.    Testing/Procedures: Your EKG shows rate controlled atrial fibrillation.   A chest x-ray takes a picture of the organs and structures inside the chest, including the heart, lungs, and blood vessels. This test can show several things, including, whether the heart is enlarges; whether fluid is building up in the lungs; and whether pacemaker / defibrillator leads are still in place.   Follow-Up: At [facility], you and your health needs are our priority.  As part of our continuing mission to provide you with exceptional heart care, we have created designated Provider Care Teams.  These Care Teams include your primary Cardiologist (physician) and Advanced Practice Providers (APPs -  Physician Assistants and Nurse Practitioners) who all work together to provide you with the care you need, when you need it.  We recommend signing up for the patient portal called "MyChart".  Sign up information is provided on this After Visit Summary.  MyChart is used to connect with patients for Virtual Visits (Telemedicine).  Patients are able to view lab/test results, encounter notes, upcoming appointments, etc.  Non-urgent messages can be sent to your provider as well.   To learn more about what you can do with MyChart, go to [email or web address].    Your next appointment:   3 week(s)  The format for your next appointment:   In Person  Provider:   You may see Dr. Graciela Husbands or one of the following Advanced Practice Providers on your designated Care Team:    Nicolasa Ducking, NP  Eula Listen, PA-C  Marisue Ivan, PA-C  Cadence Fransico Michael,  [place]  Gillian Shields, NP  Other Instructions  Recommend wearing compression stockings and wrapping your right leg to help with the swelling.   Increase your fluid intake to drink at least the equivalent of 3 bottles of water per day.   Alver Sorrow, NP will call you in 1 week to check on your leg swelling.

## 2021-01-24 ENCOUNTER — Telehealth: Payer: Self-pay | Admitting: Family

## 2021-01-24 DIAGNOSIS — I428 Other cardiomyopathies: Secondary | ICD-10-CM

## 2021-01-24 DIAGNOSIS — J90 Pleural effusion, not elsewhere classified: Secondary | ICD-10-CM

## 2021-01-24 DIAGNOSIS — I5020 Unspecified systolic (congestive) heart failure: Secondary | ICD-10-CM

## 2021-01-24 MED ORDER — FUROSEMIDE 40 MG PO TABS: ORAL_TABLET | ORAL | 3 refills | Status: DC

## 2021-01-24 NOTE — Telephone Encounter (Signed)
Called wife, [name], per DPR to review CXR results.  Bilateral pleural effusion. Start Lasix 40mg  PO QD x 2 days then Lasix 20mg  daily. She has Lasix 40mg  tablets at home which they will use. For prevention of hypotension and protection of renal function, stop Losartan 12.5mg  QD. [facility]. She verbalized understanding of directions and was able to repeat them back to me.   She reports Adam Barajas does not have new or worsening shortness of breath nor fever. Evidence of atelectasis and potential pneumonia on CXR. As he recently completed doxycycline in setting of cellulitis and his wbc count has normalized, will defer repeat antibiotics at this time. He has some congested cough and OTC Muccinex was suggested.  Noted central and lower lung interstitial prominence likely chronic. Pleural base calcifications along lateral lower hemithraces consistent with prior asbestos exposure. Cardiomegaly which is expected in setting of HF diagnosis.   , NP

## 2021-01-24 NOTE — Telephone Encounter (Signed)
Thx and get a BNP   Presuming cardiac effusions but....Marland KitchenMarland Kitchen

## 2021-01-24 NOTE — Addendum Note (Signed)
Addended by: Alver Sorrow on: 01/24/2021 06:22 PM   Modules accepted: Orders

## 2021-01-31 ENCOUNTER — Telehealth: Payer: Self-pay | Admitting: *Deleted

## 2021-01-31 ENCOUNTER — Other Ambulatory Visit
Admission: RE | Admit: 2021-01-31 | Discharge: 2021-01-31 | Disposition: A | Discharge status: Home / Self Care | Payer: Medicare Other | Source: Ambulatory Visit | Attending: Family | Admitting: Family

## 2021-01-31 ENCOUNTER — Encounter: Payer: Self-pay | Admitting: Family

## 2021-01-31 DIAGNOSIS — I5020 Unspecified systolic (congestive) heart failure: Secondary | ICD-10-CM | POA: Diagnosis present

## 2021-01-31 DIAGNOSIS — I428 Other cardiomyopathies: Secondary | ICD-10-CM | POA: Diagnosis present

## 2021-01-31 DIAGNOSIS — J90 Pleural effusion, not elsewhere classified: Secondary | ICD-10-CM | POA: Diagnosis present

## 2021-01-31 LAB — BASIC METABOLIC PANEL
Anion gap: 8 (ref 5–15)
BUN: 31 mg/dL — ABNORMAL HIGH (ref 8–23)
CO2: 22 mmol/L (ref 22–32)
Calcium: 9.1 mg/dL (ref 8.9–10.3)
Chloride: 108 mmol/L (ref 98–111)
Creatinine, Ser: 1.71 mg/dL — ABNORMAL HIGH (ref 0.61–1.24)
GFR, Estimated: 38 mL/min — ABNORMAL LOW (ref >60)
Glucose, Bld: 94 mg/dL (ref 70–99)
Potassium: 4.6 mmol/L (ref 3.5–5.1)
Sodium: 138 mmol/L (ref 135–145)

## 2021-01-31 LAB — BRAIN NATRIURETIC PEPTIDE: B Natriuretic Peptide: 801.8 pg/mL — ABNORMAL HIGH (ref 0.0–100.0)

## 2021-01-31 NOTE — Telephone Encounter (Signed)
Notified pt of provider's recc below. Pt's appt rescheduled sooner to next Thursday 4/7 @ 1:30PM. Pt will continue to monitor BP and bring log to appt. Pt has no further questions at this time.

## 2021-01-31 NOTE — Telephone Encounter (Signed)
In the setting of persistent shortness of breath, let's move his follow up appointment to next week (4/4-4/8) to reassess. He may see me or Dr. Graciela Husbands. If he is scheduled with me, may be helpful to have visit on Tuesday or Thursday when Dr. Graciela Husbands is in clinic if this works with his schedule. If he will please bring BP log to his next follow up visit.  Alver Sorrow, NP

## 2021-01-31 NOTE — Telephone Encounter (Signed)
-----   Message from Alver Sorrow, NP sent at 01/31/2021 12:53 PM EDT ----- Creatinine slightly above his baseline of 1.5. Likely due to use of diuretic in setting of pleural effusions. Normal electrolytes. BNP shows fluid overload which is not surprising in setting of known effusions. Recommend continuing Lasix 20mg  QD.  Please reassess how his swelling, breathing, BP have been since transition to Lasix.

## 2021-01-31 NOTE — Telephone Encounter (Signed)
Spoke to pt, notified of lab results and provider's recc.  Pt states that he still gets short of breath with minimal exertion, "after standing up and taking a couple steps."  Swelling has improved, he is trying to also keep legs elevated when able.  Has not been consistently taking blood pressure, pt in car at this time and not sure of readings, but will let us know when home regarding what BP is today and other readings.

## 2021-02-08 ENCOUNTER — Encounter: Payer: Self-pay | Admitting: Family

## 2021-02-08 ENCOUNTER — Other Ambulatory Visit: Payer: Self-pay

## 2021-02-08 ENCOUNTER — Ambulatory Visit (INDEPENDENT_AMBULATORY_CARE_PROVIDER_SITE_OTHER): Payer: Medicare Other | Admitting: Family

## 2021-02-08 VITALS — BP 110/52 | HR 66 | Ht 68.58 in | Wt 148.4 lb

## 2021-02-08 DIAGNOSIS — Z7901 Long term (current) use of anticoagulants: Secondary | ICD-10-CM

## 2021-02-08 DIAGNOSIS — N1832 Chronic kidney disease, stage 3b: Secondary | ICD-10-CM

## 2021-02-08 DIAGNOSIS — I428 Other cardiomyopathies: Secondary | ICD-10-CM | POA: Diagnosis not present

## 2021-02-08 DIAGNOSIS — I5020 Unspecified systolic (congestive) heart failure: Secondary | ICD-10-CM

## 2021-02-08 DIAGNOSIS — I4821 Permanent atrial fibrillation: Secondary | ICD-10-CM

## 2021-02-08 DIAGNOSIS — D649 Anemia, unspecified: Secondary | ICD-10-CM

## 2021-02-08 DIAGNOSIS — J90 Pleural effusion, not elsewhere classified: Secondary | ICD-10-CM | POA: Diagnosis not present

## 2021-02-08 NOTE — Patient Instructions (Addendum)
Medication Instructions:  Your physician has recommended you make the following change in your medication:   CHANGE Furosemide (Lasix) to 40mg  daily for 3 days   THEN return to Furosemide (Lasix) 20mg  daily  *If you need a refill on your cardiac medications before your next appointment, please call your pharmacy*   Lab Work: Your provider recommends lab work today: BNP, BMP, CBC  If you have labs (blood work) drawn today and your tests are completely normal, you will receive your results only by: MyChart Message (if you have MyChart) OR . A paper copy in the mail If you have any lab test that is abnormal or we need to change your treatment, we will call you to review the results.   Testing/Procedures: Your EKG today showed stable rate controlled atrial fibrillation   Follow-Up: At [facility], you and your health needs are our priority.  As part of our continuing mission to provide you with exceptional heart care, we have created designated Provider Care Teams.  These Care Teams include your primary Cardiologist (physician) and Advanced Practice Providers (APPs -  Physician Assistants and Nurse Practitioners) who all work together to provide you with the care you need, when you need it.  We recommend signing up for the patient portal called "MyChart".  Sign up information is provided on this After Visit Summary.  MyChart is used to connect with patients for Virtual Visits (Telemedicine).  Patients are able to view lab/test results, encounter notes, upcoming appointments, etc.  Non-urgent messages can be sent to your provider as well.   To learn more about what you can do with MyChart, go to Marland Kitchen.    Your next appointment:   As scheduled with Dr. [facility]   Other Instructions  Recommend strict less than 2 liters of fluid restriction  Continue low salt diet  Elevate legs when sitting. Try to wear compression stockings or compression wraps during the day.    Recommend weighing daily and keeping a log. Please call our office if you have weight gain of 2 pounds overnight or 5 pounds in 1 week.   Date  Time Weight

## 2021-02-08 NOTE — Progress Notes (Signed)
Office Visit    Patient Name: Adam Barajas Date of Encounter: 02/08/2021  PCP:  Dortha Kern, MD   [facility]  Cardiologist:  Dr. Graciela Husbands  Chief Complaint    Adam Barajas is a 87 y.o. male with a hx of dyspnea, permanent atrial fibrillation on Eliquis, nonischemic cardiomyopathy, LBBB, anemia, dementia, CKDIII presents today for follow up of pleural effusion and shortness of breath.   Past Medical History    Past Medical History:  Diagnosis Date  . BPH (benign prostatic hyperplasia)   . Chronic atrial fibrillation (HCC)   . Hyperlipidemia   . Hypertension    History reviewed. No pertinent surgical history.  Allergies  Allergies  Allergen Reactions  . Amoxicillin-Pot Clavulanate Hives  . Poison Ivy Extract     History of Present Illness    Adam Barajas is a 87 y.o. male with a hx of dyspnea, permanent atrial fibrillation on Eliquis, nonischemic cardiomyopathy, LBBB, anemia, dementia, CKDIII. He was last seen 01/23/21.  Echo 04/2016 with LVEF 45%. Subsequent stress test 04/2016 abnormal septal motion consistentw ith LBBB otherwise normal wall motion with no evidence of ischemia or infarction. Previous CPX testing 11/2016 with submaximal effort during stress.   Echocardiogram 07/20/20 with LVEF 35-40%, LV global hypokinesis, indeterminate LV diastolic parameters, RVSF mildly reduced with moderately elevate PASP, LA moderately dilated, RA severely dilated, mild-mod MR, RA pressure 8 mmHg.  Lab work 12/28/20 with BUN/creatinine 52/2.48 recommended to hold Lasix and Losartan. Repeat lab work 01/10/21 with BUN/creatinine 37/1.52. He has established with Dr. Merlene Pulling of hematology.  He was also treated for cellulitis by outside provider with doxyxycline 100mg  BID and keflex 500mg  TID.   Labs 01/17/21 with TSH 4.666, T4 1.44.   Follow-up 01/23/21 visit related with his daughter and his wife.  He noted having 8 loose BMs per day which is longstanding and  discussed that this would be contributory to hypovolemia.  He reported no chest pain and noted stable dyspnea on exertion.  Noted congested cough which was ongoing for some time without wheezing.  Right lower extremity edema was improving and no recurrent redness after completion of doxycycline for cellulitis.  Initial plan was to resume low-dose losartan however subsequent chest x-ray showed bilateral pleural effusions associated with basilar opacity and he was recommended to resume Lasix 20 mg daily.  Addition of antibiotics deferred as he had just completed course of doxycycline for cellulitis and his WBC had returned to normal.  Repeat lab work 01/31/2021 showed BNP 801.8, GFR 38, creatinine 1.71, K4.6.  This was slightly worse than his baseline renal function.  He presents today for follow up with his wife, daughter and son who are visiting from out of town. Since last seen his Atorvastatin was stopped to see if it would improve his diarrhea. He just stopped it yesterday. Shares with me that he is supposed to be on 3L of oxygen at home but is having difficulties with it and has phone call in to have it fixed. This was started at PCP visit earlier this week and prescribed for ambulation. That office note is unfortunately unable for my review. He is 95% on room air today.   History assisted by his wife due to his dementia. Tells me over the weekend he had two days he did not want to get out of bed. Did drink electrolyte supplements and we discussed that this is high in sodium.   Reports his dyspnea on exertion is stable on his  baseline. He notes increasing LE edema with some weeping. No redness nor change in temperature.   EKGs/Labs/Other Studies Reviewed:   The following studies were reviewed today:  Chest x-ray 01/23/2021 IMPRESSION: 1. Bilateral pleural effusions associated with basilar opacity, the latter finding likely atelectasis/scarring. Pneumonia should be considered in the proper clinical  setting. 2. Central and lower lung interstitial prominence, that is most likely chronic. No convincing pulmonary edema. 3. Pleural base calcifications along the lateral lower hemithoraces consistent with prior asbestos exposure. 4. Mild cardiomegaly.    Echo 07/20/20 1. Left ventricular ejection fraction, by estimation, is 35 to 40%. The  left ventricle has moderately decreased function. The left ventricle  demonstrates global hypokinesis. Left ventricular diastolic parameters are  indeterminate.   2. Right ventricular systolic function is mildly reduced. The right  ventricular size is normal. There is moderately elevated pulmonary artery  systolic pressure.   3. Left atrial size was moderately dilated.   4. Right atrial size was severely dilated.   5. The mitral valve is normal in structure. Mild to moderate mitral valve  regurgitation.   6. Tricuspid valve regurgitation is moderate.   7. The aortic valve is normal in structure. Aortic valve regurgitation is  not visualized.   8. The inferior vena cava is normal in size with <50% respiratory  variability, suggesting right atrial pressure of 8 mmHg.   EKG:  EKG is ordered today.  The ekg ordered today demonstrates atrial fibrillation 66 bpm with left axis deviation and overall low voltage with no acute ST/T wave changes.  Recent Labs: 01/10/2021: ALT 19 01/17/2021: Hemoglobin 9.3; Platelets 228; TSH 4.666 01/31/2021: B Natriuretic Peptide 801.8; BUN 31; Creatinine, Ser 1.71; Potassium 4.6; Sodium 138  Recent Lipid Panel No results found for: CHOL, TRIG, HDL, CHOLHDL, VLDL, LDLCALC, LDLDIRECT  Home Medications   Current Meds  Medication Sig  . acetaminophen (TYLENOL) 500 MG tablet Take 500 mg by mouth as needed.  Adam Barajas Kitchen apixaban (ELIQUIS) 2.5 MG TABS tablet Take 1 tablet (2.5 mg total) by mouth 2 (two) times daily.  . Ascorbic Acid (VITAMIN C) 1000 MG tablet Take 1,000 mg by mouth daily.  . furosemide (LASIX) 20 MG tablet Take 20 mg by  mouth every morning.  . gabapentin (NEURONTIN) 100 MG capsule Take 100 mg by mouth daily.  . memantine (NAMENDA) 10 MG tablet Take by mouth 2 (two) times daily.  . tamsulosin (FLOMAX) 0.4 MG CAPS capsule Take 0.4 mg by mouth daily.     Review of Systems   All other systems reviewed and are otherwise negative except as noted above.  Physical Exam    VS:  BP (!) 110/52 (BP Location: Left Arm, Patient Position: Sitting, Cuff Size: Normal)   Pulse 66   Ht 5' 8.58" (1.742 m)   Wt 148 lb 6 oz (67.3 kg)   SpO2 95%   BMI 22.18 kg/m  , BMI Body mass index is 22.18 kg/m.  Wt Readings from Last 3 Encounters:  02/08/21 148 lb 6 oz (67.3 kg)  01/23/21 155 lb (70.3 kg)  01/17/21 149 lb 14.6 oz (68 kg)    GEN: Well nourished, well developed, in no acute distress. HEENT: normal. Neck: Supple, no JVD, carotid bruits, or masses. Cardiac: Irregularly irregular, no murmurs, rubs, or gallops. No clubbing, cyanosis.  Radials/DP/PT 2+ and equal bilaterally. Bilateral LE with 3+ pitting edema to mid calf. LLE with weeping.  Respiratory:  Respirations regular and unlabored, diminished bases bilaterally, no adventitious lung sounds. GI:  Soft, nontender, nondistended. MS: No deformity or atrophy. Skin: Warm and dry, no rash. Neuro:  Strength and sensation are intact. Psych: Normal affect.  Assessment & Plan    1. RBBB / bradycardia / permanent atrial fibrillation / chronic anticoagulation - Continue to follow with Dr. Graciela Husbands.  Denies bleeding complications on Eliquis 2.5 mg twice daily-we will continue.  Dose appropriately reduced.  Rate controlled without AV nodal blocking agent able to edition due to relative hypotension.  2. LE edema / cellulitis - Treated with doxyxycline/keflex starting 12/28/20 for cellulitis. Edema had improved but now with bilateral 3+ pitting edema and LLE weepign. Increase Lasix to 40mg  QD x3 days then reduce to 20mg  daily. BMP, BNP, CBC today. Compression wraps encouraged.  Encouraged to elevate lower extremities. No evidence of recurrent cellulitis, defer abx at this time.   3. Nonischemic cardiomyopathy -07/2020 LVEF 35-40%, RV SF mildly reduced, moderately elevated PASP, LA moderately dilated, RA severely dilated, moderate.  NYHA II-III with dyspnea on exertion. Edema as above. Interestingly weight is down yet swelling is increased. GDMT limited by hypotension and orthostatic lightheadedness.  Coreg, losartan, Lasix have previously been held. Lasix has been resumed. Will defer addition of Losartan due to hypotension.   4. Pleural effusion - Bilateral pleural effusion noted on CXR 01/31/21. Diuretic initiated. Reports stable dyspnea on exertion. We will defer repeat chest x-ray at this time but could be considered in future for monitoring. No indication for thoracentesis at this time. BNP today.  5. HTN - BP well controlled. Continue current antihypertensive regimen.   6. Dementia - Visit assisted by daughter and wife.  7. Anemia - Recent hemoglobin stable at 9.3.  Continue to follow with Dr. 06[birth year]09-1986.  We discussed monitoring of anemia at least every 6 months with CBC.  8. CKDIII - Careful titration of diuretics and antihypertensives.  Avoid nephrotoxic agents.  Creatinine is back to his baseline of 1.5.  BMP today.   Disposition: Follow up in May as scheduled with Dr. Merlene Pulling. Signed, June, NP 02/08/2021, 1:47 PM  [facility]

## 2021-02-09 LAB — BASIC METABOLIC PANEL
BUN/Creatinine Ratio: 19 (ref 10–24)
BUN: 31 mg/dL — ABNORMAL HIGH (ref 8–27)
CO2: 22 mmol/L (ref 20–29)
Calcium: 9.2 mg/dL (ref 8.6–10.2)
Chloride: 102 mmol/L (ref 96–106)
Creatinine, Ser: 1.61 mg/dL — ABNORMAL HIGH (ref 0.76–1.27)
Glucose: 110 mg/dL — ABNORMAL HIGH (ref 65–99)
Potassium: 4.8 mmol/L (ref 3.5–5.2)
Sodium: 141 mmol/L (ref 134–144)
eGFR: 41 mL/min/{1.73_m2} — ABNORMAL LOW (ref >59)

## 2021-02-09 LAB — CBC
Hematocrit: 31.3 % — ABNORMAL LOW (ref 37.5–51.0)
Hemoglobin: 10.2 g/dL — ABNORMAL LOW (ref 13.0–17.7)
MCH: 30.4 pg (ref 26.6–33.0)
MCHC: 32.6 g/dL (ref 31.5–35.7)
MCV: 93 fL (ref 79–97)
Platelets: 199 10*3/uL (ref 150–450)
RBC: 3.35 x10E6/uL — ABNORMAL LOW (ref 4.14–5.80)
RDW: 12.9 % (ref 11.6–15.4)
WBC: 8.2 10*3/uL (ref 3.4–10.8)

## 2021-02-09 LAB — BRAIN NATRIURETIC PEPTIDE: BNP: 569.6 pg/mL — ABNORMAL HIGH (ref 0.0–100.0)

## 2021-02-12 ENCOUNTER — Telehealth: Payer: Self-pay

## 2021-02-12 NOTE — Telephone Encounter (Signed)
Appreciative of nursing assistance in regards to obtaining additional information and providing assistance.  Symptoms are suggestive of possible depression and I agree with recommendations to follow up with primary care provider. Important to follow up with PCP in case there is additional cause as well. Low suspicion cardiac etiology.   Important to maintain <2L fluid per day and low sodium diet in setting of heart failure and edema.  Alver Sorrow, NP

## 2021-02-12 NOTE — Telephone Encounter (Signed)
Was able to reach back out to pt's wife Adam Barajas as Adam Shields, NP advised (DPRapproved), she is concern for pt's well being given that his labs and diagnostic testing have been pretty normal. Wife reports pt is wanting to spend majority of his time in bed sleeping. Sleeps at night and during the day as well. Stated today she called Adam Barajas 3 times to try and get him up out of bed and take care of the animals and Adam Barajas gets angry and stats" I will later", reports he did get up to tend to the animals, then went back to bed, but wife is unsure if this is really true.  He did get out of bed some this weekend while his children were visiting, but when they left he went back into bed. Also, reports Adam Barajas is not eating or drinking unless wife is bringing him the food and having to "strongly encourage" him to eat/drink. Pt also getting more irrigated with the "nagging" of him staying in bed and sleeping. Advised to reach out to PCP with concerns of depression and possible signs of heading towards end of life, rather it be weeks, months, or years. Wife reports did reach out to PCP and she stated the same possibility.   Pt is on low dose gabapentin daily and taking namenda, both can cause sleepiness. Advised if PCP places pt on depression meds, it can mellow him out more causing more sleepiness. Educated wife that forcing Adam Barajas to eat/drink and trying to force him out of bed with his decrease cognition with onset dementia could cause irritability, aggression, and behavior changes. Advised encouragement with getting up at least once every hour, even if for only a few minutes and to walk to bathroom or sitting in a chair. Encourage favorite foods and drinks to his liking and okay if not eating as much as before. Strongly avoid against confrontation as this can cause frustration and push back. Wife verbalized understanding, very grateful for the return call, advised will route message to [facility], NP so  she may be aware or concerns. Will call back with anything further and will keep PCP notified as well.

## 2021-02-12 NOTE — Telephone Encounter (Signed)
Attempted to return wife's call, unable to VM d/t VM full.

## 2021-02-14 ENCOUNTER — Ambulatory Visit: Payer: Medicare Other | Admitting: Family

## 2021-03-29 ENCOUNTER — Encounter: Payer: Self-pay | Admitting: Internal Medicine

## 2021-03-29 ENCOUNTER — Other Ambulatory Visit: Payer: Self-pay

## 2021-03-29 ENCOUNTER — Ambulatory Visit (INDEPENDENT_AMBULATORY_CARE_PROVIDER_SITE_OTHER): Payer: Medicare Other | Admitting: Internal Medicine

## 2021-03-29 VITALS — BP 140/72 | HR 69 | Ht 68.5 in | Wt 134.0 lb

## 2021-03-29 DIAGNOSIS — R001 Bradycardia, unspecified: Secondary | ICD-10-CM

## 2021-03-29 DIAGNOSIS — I872 Venous insufficiency (chronic) (peripheral): Secondary | ICD-10-CM

## 2021-03-29 DIAGNOSIS — I10 Essential (primary) hypertension: Secondary | ICD-10-CM | POA: Diagnosis not present

## 2021-03-29 DIAGNOSIS — I428 Other cardiomyopathies: Secondary | ICD-10-CM

## 2021-03-29 DIAGNOSIS — I4821 Permanent atrial fibrillation: Secondary | ICD-10-CM

## 2021-03-29 MED ORDER — LOSARTAN POTASSIUM 25 MG PO TABS: 12.5000 mg | ORAL_TABLET | Freq: Every day | ORAL | Status: AC

## 2021-03-29 NOTE — Patient Instructions (Addendum)
Medication Instructions:  - Your physician has recommended you make the following change in your medication:   1) RESUME losartan 25 mg- take 0.5 tablet (12.5 mg) by mouth once daily  2) Continue furosemide 20 mg - take 1 tablet (20 mg) once daily, BUT you may take 40 mg twice a week as needed for increase swelling/ shortness of breath  *If you need a refill on your cardiac medications before your next appointment, please call your pharmacy*   Lab Work: - none ordered  If you have labs (blood work) drawn today and your tests are completely normal, you will receive your results only by: Marland Kitchen MyChart Message (if you have MyChart) OR . A paper copy in the mail If you have any lab test that is abnormal or we need to change your treatment, we will call you to review the results.   Testing/Procedures: - You have been referred to : Dr. Wyn Quaker at [facility] (for Venous Insufficiency)  Dr. Driscilla Grammes office should contact you directly for an appointment.      Follow-Up: At [facility], you and your health needs are our priority.  As part of our continuing mission to provide you with exceptional heart care, we have created designated Provider Care Teams.  These Care Teams include your primary Cardiologist (physician) and Advanced Practice Providers (APPs -  Physician Assistants and Nurse Practitioners) who all work together to provide you with the care you need, when you need it.  We recommend signing up for the patient portal called "MyChart".  Sign up information is provided on this After Visit Summary.  MyChart is used to connect with patients for Virtual Visits (Telemedicine).  Patients are able to view lab/test results, encounter notes, upcoming appointments, etc.  Non-urgent messages can be sent to your provider as well.   To learn more about what you can do with MyChart, go to [email or web address].    Your next appointment:   6 month(s)  The format for your next  appointment:   In Person  Provider:   Sherryl Manges, MD   Other Instructions n/a

## 2021-03-29 NOTE — Progress Notes (Signed)
Patient Care Team: Dortha Kern, MD as PCP - General (Family Medicine)   HPI  Adam Barajas is a 87 y.o. male Seen in follow-up for exercise associated dyspnea in context of permanent atrial fibrillation, anticoagulated with apixaban, and a mild  nonischemic cardiomyopathy and  baseline left bundle branch block.  No bleeding issues   Cardiopulmonary stress testing >> maximum heart rate was 121     Interval hospitalizations for cellulits and apparently a diagnosis of vasculitis, complicated by renal insufficiency improved with medication changes.  Interval evaluation for cough and wheezing demonstrated bilateral pleural effusions resumption of diuretics.  The patient denies chest pain, nocturnal dyspnea, orthopnea.  There have been no palpitation, lightheadedness or syncope.  Complains of dyspnea on exertion which is considerably improved.  Still with edema, weeping and then cellulitis .   BP at home normally  130-140s.    Memory still an issue, wife exhausted.  She needs knee replacement   History of anemia with a prior diagnosis of MDS made at [facility]; seen by Dr. Merlene Pulling 3/22 and a presumptive diagnosis of anemia of chronic disease with leukocytosis unassociated with abnormalities on smear or cytometry.      Memory worse  Wife exhausted   DATE TEST EF   6/17    myoview  52 %  no ischemia  6/17 echo  40-45 %  Mod LAE  9/21 Echo  35-40% PA pres// RV dysfun-mild RAE severe/LAE mod   Date Cr K Hgb  1/17 1.0 4.9    8/17    12.7  10/18 0.95 4.6 12.6  7/19   12.6  5/21 1.5 4.6 10.2<<8.2  8/21   9.3  4/22 1.6 4.8 10.2     Records and Results Reviewed   Past Medical History:  Diagnosis Date  . BPH (benign prostatic hyperplasia)   . Chronic atrial fibrillation (HCC)   . Hyperlipidemia   . Hypertension     History reviewed. No pertinent surgical history.  Current Outpatient Medications  Medication Sig Dispense Refill  . acetaminophen (TYLENOL) 500 MG tablet  Take 500 mg by mouth as needed.    Marland Kitchen apixaban (ELIQUIS) 2.5 MG TABS tablet Take 1 tablet (2.5 mg total) by mouth 2 (two) times daily. 180 tablet 3  . Ascorbic Acid (VITAMIN C) 1000 MG tablet Take 1,000 mg by mouth daily.    . furosemide (LASIX) 20 MG tablet Take 20 mg by mouth every morning.    . gabapentin (NEURONTIN) 100 MG capsule Take 100 mg by mouth daily.    . memantine (NAMENDA) 10 MG tablet Take by mouth 2 (two) times daily.    . OXYGEN Inhale 3 L into the lungs daily.    . tamsulosin (FLOMAX) 0.4 MG CAPS capsule Take 0.4 mg by mouth daily.     No current facility-administered medications for this visit.    Allergies  Allergen Reactions  . Amoxicillin-Pot Clavulanate Hives  . Poison Ivy Extract       Review of Systems negative except from HPI and PMH  Physical Exam BP 140/72 (BP Location: Left Arm, Patient Position: Sitting, Cuff Size: Normal)   Pulse 69   Ht 5' 8.5" (1.74 m)   Wt 134 lb (60.8 kg)   SpO2 96%   BMI 20.08 kg/m  Well developed and nourished in no acute distress HENT normal Neck supple with JVP-flat Carotids brisk and full without bruits Clear Irregularly irregular rate and rhythm with controlled  ventricular response, no  murmurs or gallops Abd-soft with active BS without hepatomegaly No Clubbing cyanosis 1+  edema Skin-warm and dry some weeping A & Oriented x1   Grossly normal sensory and motor function  ECG: AF  @69             Intervals  -/12/44  Axis -34     Assessment and  Plan   Cardiomyopathy-mild  Atrial fibrillation-permanent  Bradycardia  Hypertension   Dyspnea/CHF chronic class 2B  Dementia  End of life discussion  Renal insuff grade 3  Atrial fibrillation is permanent with adequate rate control.  No bleeding on apixaban we will continue it at 2.5 mg dosed appropriately for age renal function and weight  Cardiomyopathy medications have been limited by blood pressure and renal function, and currently only on furosemide  and we will continue at 20 milligrams daily but increase to 40 daily as needed up 2/w  Dyspnea somewhat better.  There is moderate cardiomyopathy, and blood pressure is now better and renal function better we will resume his losartan 12.5 mg a day.  Given comorbidities would not probably increase it past that.  Discussed again consideration of palliative care.  She remains reluctant.[id number]

## 2021-05-04 ENCOUNTER — Encounter (INDEPENDENT_AMBULATORY_CARE_PROVIDER_SITE_OTHER): Payer: Medicare Other | Admitting: Vascular Surgery

## 2021-05-25 ENCOUNTER — Other Ambulatory Visit: Payer: Self-pay

## 2021-05-25 ENCOUNTER — Ambulatory Visit (INDEPENDENT_AMBULATORY_CARE_PROVIDER_SITE_OTHER): Payer: Medicare Other | Admitting: Vascular Surgery

## 2021-05-25 ENCOUNTER — Encounter (INDEPENDENT_AMBULATORY_CARE_PROVIDER_SITE_OTHER): Payer: Self-pay | Admitting: Vascular Surgery

## 2021-05-25 VITALS — BP 118/65 | HR 72 | Resp 16 | Ht 67.0 in | Wt 117.4 lb

## 2021-05-25 DIAGNOSIS — N183 Chronic kidney disease, stage 3 unspecified: Secondary | ICD-10-CM | POA: Diagnosis not present

## 2021-05-25 DIAGNOSIS — Z9289 Personal history of other medical treatment: Secondary | ICD-10-CM | POA: Insufficient documentation

## 2021-05-25 DIAGNOSIS — M7989 Other specified soft tissue disorders: Secondary | ICD-10-CM | POA: Diagnosis not present

## 2021-05-25 DIAGNOSIS — I4891 Unspecified atrial fibrillation: Secondary | ICD-10-CM | POA: Insufficient documentation

## 2021-05-25 DIAGNOSIS — N189 Chronic kidney disease, unspecified: Secondary | ICD-10-CM | POA: Insufficient documentation

## 2021-05-25 DIAGNOSIS — I10 Essential (primary) hypertension: Secondary | ICD-10-CM

## 2021-05-25 DIAGNOSIS — L97201 Non-pressure chronic ulcer of unspecified calf limited to breakdown of skin: Secondary | ICD-10-CM

## 2021-05-25 DIAGNOSIS — L97209 Non-pressure chronic ulcer of unspecified calf with unspecified severity: Secondary | ICD-10-CM | POA: Insufficient documentation

## 2021-05-25 DIAGNOSIS — I519 Heart disease, unspecified: Secondary | ICD-10-CM | POA: Insufficient documentation

## 2021-05-25 DIAGNOSIS — D631 Anemia in chronic kidney disease: Secondary | ICD-10-CM | POA: Insufficient documentation

## 2021-05-25 NOTE — Assessment & Plan Note (Signed)
Likely contributes to lower extremity swelling

## 2021-05-25 NOTE — Assessment & Plan Note (Signed)
The patient is superficial ulcerations of both legs, left greater than right.  We are going to wrap him in 3 layer Unna boots today and these were placed and will be changed weekly.  We will reassess in several weeks to come out of the [employer]

## 2021-05-25 NOTE — Progress Notes (Signed)
Patient ID: Adam Barajas, male   DOB: Feb 20, 1930, 87 y.o.   MRN: [id number]  Chief Complaint  Patient presents with   New Patient (Initial Visit)    Ref Graciela Husbands venous insufficiency    HPI Adam Barajas is a 87 y.o. male.  I am asked to see the patient by Dr. Odessa Fleming for evaluation of leg swelling, intermittent ulceration, and recurrent cellulitis.  He has had cellulitis in both legs now.  His left leg is more severely affected the 2 legs.  He still has some raw areas and scabs on the left leg currently as well as a raw area on the right leg.  His leg swelling is currently down but not gone.  The legs are heavy and it does make walking more difficult particular when he has flares.  He has had to be hospitalized for these.  The patient denies any trauma, injury, or clear inciting event.  No previous history of DVT to their knowledge.  His daughter provides much of the history although he does contribute.  No fevers or chills or signs of systemic infection at this time.     Past Medical History:  Diagnosis Date   BPH (benign prostatic hyperplasia)    Chronic atrial fibrillation (HCC)    Hyperlipidemia    Hypertension     Past Surgical History:  Procedure Laterality Date   BACK SURGERY     EYE SURGERY Bilateral      Family History  Problem Relation Age of Onset   Hyperlipidemia Father   No bleeding disorders, clotting disorders, autoimmune diseases, or aneurysms   Social History   Tobacco Use   Smoking status: Never   Smokeless tobacco: Never  Substance Use Topics   Alcohol use: Yes    Comment: 2-3  drinks per day.    Drug use: No     Allergies  Allergen Reactions   Amoxicillin-Pot Clavulanate Hives   Poison Ivy Extract     Current Outpatient Medications  Medication Sig Dispense Refill   acetaminophen (TYLENOL) 500 MG tablet Take 500 mg by mouth as needed.     apixaban (ELIQUIS) 2.5 MG TABS tablet Take 1 tablet (2.5 mg total) by mouth 2 (two) times daily. 180  tablet 3   Ascorbic Acid (VITAMIN C) 1000 MG tablet Take 1,000 mg by mouth daily.     furosemide (LASIX) 20 MG tablet Take 1 tablet (20 mg) by mouth once every morning. You may take 2 tablets (40 mg) twice a week as needed for increased swelling/ shortness of breath/ weight gain     gabapentin (NEURONTIN) 100 MG capsule Take 100 mg by mouth daily.     losartan (COZAAR) 25 MG tablet Take 0.5 tablets (12.5 mg total) by mouth daily.     memantine (NAMENDA) 10 MG tablet Take by mouth 2 (two) times daily.     OXYGEN Inhale 3 L into the lungs daily.     tamsulosin (FLOMAX) 0.4 MG CAPS capsule Take 0.4 mg by mouth daily.     No current facility-administered medications for this visit.      REVIEW OF SYSTEMS (Negative unless checked)  Constitutional: [] Weight loss  [] Fever  [] Chills Cardiac: [] Chest pain   [] Chest pressure   [] Palpitations   [] Shortness of breath when laying flat   [] Shortness of breath at rest   [] Shortness of breath with exertion. Vascular:  [] Pain in legs with walking   [] Pain in legs at rest   [] Pain in legs  when laying flat   [] Claudication   [] Pain in feet when walking  [] Pain in feet at rest  [] Pain in feet when laying flat   [] History of DVT   [] Phlebitis   [x] Swelling in legs   [] Varicose veins   [x] Non-healing ulcers Pulmonary:   [] Uses home oxygen   [] Productive cough   [] Hemoptysis   [] Wheeze  [] COPD   [] Asthma Neurologic:  [] Dizziness  [] Blackouts   [] Seizures   [] History of stroke   [] History of TIA  [] Aphasia   [] Temporary blindness   [] Dysphagia   [] Weakness or numbness in arms   [] Weakness or numbness in legs Musculoskeletal:  [x] Arthritis   [] Joint swelling   [x] Joint pain   [] Low back pain Hematologic:  [] Easy bruising  [] Easy bleeding   [] Hypercoagulable state   [] Anemic  [] Hepatitis Gastrointestinal:  [] Blood in stool   [] Vomiting blood  [] Gastroesophageal reflux/heartburn   [] Abdominal pain Genitourinary:  [] Chronic kidney disease   [] Difficult urination   [x] Frequent urination  [] Burning with urination   [] Hematuria Skin:  [] Rashes   [x] Ulcers   [x] Wounds Psychological:  [] History of anxiety   []  History of major depression.    Physical Exam BP 118/65 (BP Location: Right Arm)   Pulse 72   Resp 16   Ht 5\' 7"  (1.702 m)   Wt 117 lb 6.4 oz (53.3 kg)   BMI 18.39 kg/m  Gen:  WD/WN, NAD. Appears younger than stated age. Head: [place]/AT, No temporalis wasting.  Ear/Nose/Throat: Hearing diminished, nares w/o erythema or drainage, oropharynx w/o Erythema/Exudate Eyes: Conjunctiva clear, sclera non-icteric  Neck: trachea midline.  No JVD.  Pulmonary:  Good air movement, respirations not labored, no use of accessory muscles  Cardiac: Irregular Vascular:  Vessel Right Left  Radial Palpable Palpable                          DP 1+ 1+  PT 1+ NP   Gastrointestinal:. No masses, surgical incisions, or scars. Musculoskeletal: M/S 5/5 throughout.  Extremities without ischemic changes.  No deformity or atrophy.  Small rale areas on the right lower leg, several raw areas on the left lower leg as well as a scab medially.  1+ right lower extremity edema, 2+ left lower extremity edema.  Moderate stasis dermatitis changes are present bilaterally. Neurologic: Sensation grossly intact in extremities.  Symmetrical.  Speech is fluent. Motor exam as listed above. Psychiatric: Judgment intact, Mood & affect appropriate for pt's clinical situation. Dermatologic: Lower leg wounds as above    Radiology No results found.  Labs No results found for this or any previous visit (from the past 2160 hour(s)).  Assessment/Plan:  Lower limb ulcer, calf (HCC) The patient is superficial ulcerations of both legs, left greater than right.  We are going to wrap him in 3 layer Unna boots today and these were placed and will be changed weekly.  We will reassess in several weeks to come out of the Unna boots  Essential hypertension blood pressure control important in  reducing the progression of atherosclerotic disease. On appropriate oral medications.   Chronic kidney disease, stage 3 unspecified (HCC) Likely contributes to lower extremity swelling  Swelling of limb The patient has significant lower extremity swelling that is likely multifactorial.  He said recurrent cellulitis so he clearly has a component of lymphedema involved.  He may very well have venous insufficiency and we will check a venous reflux study in the near future at his convenience.  We are wrapping him in Unna boots today and will do this for several weeks.  Once he comes out of Unna boots, he will at some point need some sort of compression garment he can functionally do.  Regular compression socks been very difficult for he and his daughter.  We discussed maybe using zippered compression socks.  We will do Unna boots weekly and plan on seeing him in 3 to 4 weeks with a reflux study.      Festus Barren 05/25/2021, 11:52 AM   This note was created with Dragon medical transcription system.  Any errors from dictation are unintentional.

## 2021-05-25 NOTE — Assessment & Plan Note (Signed)
blood pressure control important in reducing the progression of atherosclerotic disease. On appropriate oral medications.  

## 2021-05-25 NOTE — Assessment & Plan Note (Signed)
The patient has significant lower extremity swelling that is likely multifactorial.  He said recurrent cellulitis so he clearly has a component of lymphedema involved.  He may very well have venous insufficiency and we will check a venous reflux study in the near future at his convenience.  We are wrapping him in Unna boots today and will do this for several weeks.  Once he comes out of Unna boots, he will at some point need some sort of compression garment he can functionally do.  Regular compression socks been very difficult for he and his daughter.  We discussed maybe using zippered compression socks.  We will do Unna boots weekly and plan on seeing him in 3 to 4 weeks with a reflux study.

## 2021-06-01 ENCOUNTER — Ambulatory Visit (INDEPENDENT_AMBULATORY_CARE_PROVIDER_SITE_OTHER): Payer: Medicare Other | Admitting: Nurse Practitioner

## 2021-06-01 ENCOUNTER — Other Ambulatory Visit: Payer: Self-pay

## 2021-06-01 ENCOUNTER — Encounter (INDEPENDENT_AMBULATORY_CARE_PROVIDER_SITE_OTHER): Payer: Self-pay | Admitting: Nurse Practitioner

## 2021-06-01 VITALS — BP 101/52 | HR 59 | Ht 67.0 in | Wt 119.0 lb

## 2021-06-01 DIAGNOSIS — L97201 Non-pressure chronic ulcer of unspecified calf limited to breakdown of skin: Secondary | ICD-10-CM | POA: Diagnosis not present

## 2021-06-01 NOTE — Progress Notes (Signed)
History of Present Illness  There is no documented history at this time  Assessments & Plan   There are no diagnoses linked to this encounter.    Additional instructions  Subjective:  Patient presents with venous ulcer of the Bilateral lower extremity.    Procedure:  3 layer unna wrap was placed Bilateral lower extremity.   Plan:   Follow up in one week.  

## 2021-06-08 ENCOUNTER — Other Ambulatory Visit: Payer: Self-pay

## 2021-06-08 ENCOUNTER — Ambulatory Visit (INDEPENDENT_AMBULATORY_CARE_PROVIDER_SITE_OTHER): Payer: Medicare Other | Admitting: Nurse Practitioner

## 2021-06-08 VITALS — BP 117/53 | HR 67 | Ht 68.0 in | Wt 120.0 lb

## 2021-06-08 DIAGNOSIS — L97201 Non-pressure chronic ulcer of unspecified calf limited to breakdown of skin: Secondary | ICD-10-CM

## 2021-06-08 NOTE — Progress Notes (Signed)
History of Present Illness  There is no documented history at this time  Assessments & Plan   There are no diagnoses linked to this encounter.    Additional instructions  Subjective:  Patient presents with venous ulcer of the Bilateral lower extremity.    Procedure:  3 layer unna wrap was placed Bilateral lower extremity.   Plan:   Follow up in one week.  

## 2021-06-15 ENCOUNTER — Other Ambulatory Visit: Payer: Self-pay

## 2021-06-15 ENCOUNTER — Encounter (INDEPENDENT_AMBULATORY_CARE_PROVIDER_SITE_OTHER): Payer: Self-pay | Admitting: Nurse Practitioner

## 2021-06-15 ENCOUNTER — Ambulatory Visit (INDEPENDENT_AMBULATORY_CARE_PROVIDER_SITE_OTHER): Payer: Medicare Other | Admitting: Nurse Practitioner

## 2021-06-15 VITALS — BP 101/52 | HR 59 | Resp 15 | Wt 120.8 lb

## 2021-06-15 DIAGNOSIS — L97201 Non-pressure chronic ulcer of unspecified calf limited to breakdown of skin: Secondary | ICD-10-CM

## 2021-06-15 NOTE — Progress Notes (Signed)
History of Present Illness  There is no documented history at this time  Assessments & Plan   There are no diagnoses linked to this encounter.    Additional instructions  Subjective:  Patient presents with venous ulcer of the Bilateral lower extremity.    Procedure:  3 layer unna wrap was placed Bilateral lower extremity.   Plan:   Follow up in one week.  

## 2021-06-15 NOTE — Progress Notes (Deleted)
History of Present Illness  There is no documented history at this time  Assessments & Plan   There are no diagnoses linked to this encounter.    Additional instructions  Subjective:  Patient presents with venous ulcer of the Bilateral lower extremity.    Procedure:  3 layer unna wrap was placed Bilateral lower extremity.   Plan:   Follow up in one week.  

## 2021-06-16 ENCOUNTER — Encounter (INDEPENDENT_AMBULATORY_CARE_PROVIDER_SITE_OTHER): Payer: Self-pay | Admitting: Nurse Practitioner

## 2021-06-17 ENCOUNTER — Encounter (INDEPENDENT_AMBULATORY_CARE_PROVIDER_SITE_OTHER): Payer: Self-pay | Admitting: Nurse Practitioner

## 2021-06-22 ENCOUNTER — Other Ambulatory Visit: Payer: Self-pay

## 2021-06-22 ENCOUNTER — Ambulatory Visit (INDEPENDENT_AMBULATORY_CARE_PROVIDER_SITE_OTHER): Payer: Medicare Other | Admitting: Nurse Practitioner

## 2021-06-22 ENCOUNTER — Ambulatory Visit (INDEPENDENT_AMBULATORY_CARE_PROVIDER_SITE_OTHER): Payer: Medicare Other

## 2021-06-22 VITALS — BP 158/46 | HR 51 | Ht 66.0 in | Wt 121.0 lb

## 2021-06-22 DIAGNOSIS — R600 Localized edema: Secondary | ICD-10-CM | POA: Diagnosis not present

## 2021-06-22 DIAGNOSIS — N183 Chronic kidney disease, stage 3 unspecified: Secondary | ICD-10-CM | POA: Diagnosis not present

## 2021-06-22 DIAGNOSIS — L97201 Non-pressure chronic ulcer of unspecified calf limited to breakdown of skin: Secondary | ICD-10-CM

## 2021-06-22 DIAGNOSIS — M7989 Other specified soft tissue disorders: Secondary | ICD-10-CM

## 2021-06-22 DIAGNOSIS — I10 Essential (primary) hypertension: Secondary | ICD-10-CM

## 2021-06-27 ENCOUNTER — Encounter (INDEPENDENT_AMBULATORY_CARE_PROVIDER_SITE_OTHER): Payer: Self-pay

## 2021-06-27 ENCOUNTER — Ambulatory Visit (INDEPENDENT_AMBULATORY_CARE_PROVIDER_SITE_OTHER): Payer: Medicare Other | Admitting: Nurse Practitioner

## 2021-06-27 ENCOUNTER — Other Ambulatory Visit: Payer: Self-pay

## 2021-06-27 VITALS — BP 135/50 | HR 54 | Resp 15 | Wt 125.0 lb

## 2021-06-27 DIAGNOSIS — M7989 Other specified soft tissue disorders: Secondary | ICD-10-CM

## 2021-06-27 DIAGNOSIS — R600 Localized edema: Secondary | ICD-10-CM | POA: Diagnosis not present

## 2021-06-27 NOTE — Progress Notes (Signed)
History of Present Illness  There is no documented history at this time  Assessments & Plan   There are no diagnoses linked to this encounter.    Additional instructions  Subjective:  Patient presents with venous ulcer of the Left lower extremity.    Procedure:  3 layer unna wrap was placed Left lower extremity.   Plan:   Follow up in one week.  

## 2021-07-01 ENCOUNTER — Encounter (INDEPENDENT_AMBULATORY_CARE_PROVIDER_SITE_OTHER): Payer: Self-pay | Admitting: Nurse Practitioner

## 2021-07-01 NOTE — Progress Notes (Signed)
Subjective:    Patient ID: Adam Barajas, male    DOB: [birth year], 87 y.o.   MRN: [id number] Chief Complaint  Patient presents with   Follow-up    4wk [place]     Adam Barajas is a 87 y.o. male.  That presents today for follow-up evaluation of Unna wraps and lower extremity ulcerations.  The patient's legs are mostly improved.  The patient continues to have small area of ulceration on the left lower extremity.  The swelling is well controlled bilaterally.  He denies any fevers or chills or any signs of infection at this time.   Review of Systems  Cardiovascular:  Positive for leg swelling.  Skin:  Positive for wound.  All other systems reviewed and are negative.     Objective:   Physical Exam Vitals reviewed.  HENT:     Head: Normocephalic.  Cardiovascular:     Rate and Rhythm: Normal rate.     Pulses: Normal pulses.  Pulmonary:     Effort: Pulmonary effort is normal.  Musculoskeletal:     Right lower leg: Edema present.     Left lower leg: Edema present.  Neurological:     Mental Status: He is alert and oriented to person, place, and time.  Psychiatric:        Mood and Affect: Mood normal.        Behavior: Behavior normal.        Thought Content: Thought content normal.        Judgment: Judgment normal.    BP (!) 158/46   Pulse (!) 51   Ht 5\' 6"  (1.676 m)   Wt 121 lb (54.9 kg)   BMI 19.53 kg/m   Past Medical History:  Diagnosis Date   BPH (benign prostatic hyperplasia)    Chronic atrial fibrillation (HCC)    Hyperlipidemia    Hypertension     Social History   Socioeconomic History   Marital status: Married    Spouse name: Not on file   Number of children: Not on file   Years of education: Not on file   Highest education level: Not on file  Occupational History   Not on file  Tobacco Use   Smoking status: Never   Smokeless tobacco: Never  Vaping Use   Vaping Use: Not on file  Substance and Sexual Activity   Alcohol use: Yes    Comment: 2-3   drinks per day.    Drug use: No   Sexual activity: Not on file  Other Topics Concern   Not on file  Social History Narrative   Not on file   Social Determinants of Health   Financial Resource Strain: Not on file  Food Insecurity: Not on file  Transportation Needs: Not on file  Physical Activity: Not on file  Stress: Not on file  Social Connections: Not on file  Intimate Partner Violence: Not on file    Past Surgical History:  Procedure Laterality Date   BACK SURGERY     EYE SURGERY Bilateral     Family History  Problem Relation Age of Onset   Hyperlipidemia Father     Allergies  Allergen Reactions   Amoxicillin-Pot Clavulanate Hives   Poison Ivy Extract     CBC Latest Ref Rng & Units 02/08/2021 01/17/2021 12/28/2020  WBC 3.4 - 10.8 x10E3/uL 8.2 6.5 20.3(HH)  Hemoglobin 13.0 - 17.7 g/dL 10.2(L) 9.3(L) 10.6(L)  Hematocrit 37.5 - 51.0 % 31.3(L) 28.6(L) 30.4(L)  Platelets 150 - 450  x10E3/uL 199 228 143(L)      CMP     Component Value Date/Time   NA 141 02/08/2021 1426   K 4.8 02/08/2021 1426   CL 102 02/08/2021 1426   CO2 22 02/08/2021 1426   GLUCOSE 110 (H) 02/08/2021 1426   GLUCOSE 94 01/31/2021 1149   BUN 31 (H) 02/08/2021 1426   CREATININE 1.61 (H) 02/08/2021 1426   CALCIUM 9.2 02/08/2021 1426   PROT 7.3 01/10/2021 1516   ALBUMIN 3.9 01/10/2021 1516   AST 29 01/10/2021 1516   ALT 19 01/10/2021 1516   ALKPHOS 139 (H) 01/10/2021 1516   BILITOT 1.2 01/10/2021 1516   GFRNONAA 38 (L) 01/31/2021 1149   GFRAA 26 (L) 12/28/2020 1201     No results found.     Assessment & Plan:   1. Skin ulcer of calf, limited to breakdown of skin, unspecified laterality (HCC) No surgery or intervention at this point in time.    I have had a long discussion with the patient regarding venous insufficiency and why it  causes symptoms, specifically venous ulceration . I have discussed with the patient the chronic skin changes that accompany venous insufficiency and the long  term sequela such as infection and recurring  ulceration.  Patient will be placed in [employer] which will be changed weekly drainage permitting.  In addition, behavioral modification including several periods of elevation of the lower extremities during the day will be continued. Achieving a position with the ankles at heart level was stressed to the patient  The patient is instructed to begin routine exercise, especially walking on a daily basis The patient will follow up in 4 weeks to evaluate progress.  2. Essential hypertension Continue antihypertensive medications as already ordered, these medications have been reviewed and there are no changes at this time.   3. Stage 3 chronic kidney disease, unspecified whether stage 3a or 3b CKD (HCC) This can contribute to lower extremity edema as well.   Current Outpatient Medications on File Prior to Visit  Medication Sig Dispense Refill   acetaminophen (TYLENOL) 500 MG tablet Take 500 mg by mouth as needed.     apixaban (ELIQUIS) 2.5 MG TABS tablet Take 1 tablet (2.5 mg total) by mouth 2 (two) times daily. 180 tablet 3   Ascorbic Acid (VITAMIN C) 1000 MG tablet Take 1,000 mg by mouth daily.     furosemide (LASIX) 20 MG tablet Take 1 tablet (20 mg) by mouth once every morning. You may take 2 tablets (40 mg) twice a week as needed for increased swelling/ shortness of breath/ weight gain     gabapentin (NEURONTIN) 100 MG capsule Take 100 mg by mouth daily.     losartan (COZAAR) 25 MG tablet Take 0.5 tablets (12.5 mg total) by mouth daily.     memantine (NAMENDA) 10 MG tablet Take by mouth 2 (two) times daily.     OXYGEN Inhale 3 L into the lungs daily.     tamsulosin (FLOMAX) 0.4 MG CAPS capsule Take 0.4 mg by mouth daily.     No current facility-administered medications on file prior to visit.    There are no Patient Instructions on file for this visit. No follow-ups on file.   Georgiana Spinner, NP

## 2021-07-04 ENCOUNTER — Telehealth (INDEPENDENT_AMBULATORY_CARE_PROVIDER_SITE_OTHER): Payer: Self-pay | Admitting: Vascular Surgery

## 2021-07-04 ENCOUNTER — Ambulatory Visit (INDEPENDENT_AMBULATORY_CARE_PROVIDER_SITE_OTHER): Payer: Medicare Other | Admitting: Nurse Practitioner

## 2021-07-04 ENCOUNTER — Other Ambulatory Visit: Payer: Self-pay

## 2021-07-04 VITALS — BP 124/65 | HR 54 | Ht 67.0 in | Wt 127.0 lb

## 2021-07-04 DIAGNOSIS — R600 Localized edema: Secondary | ICD-10-CM

## 2021-07-04 DIAGNOSIS — M7989 Other specified soft tissue disorders: Secondary | ICD-10-CM

## 2021-07-04 NOTE — Telephone Encounter (Signed)
Called to inform patient that he would be wrapped for unna boot today opposed to seeing [facility]. Patient recently had an appt with [facility] 06/22/21 with le ven reflux studies. Patient was made aware that she counts a provider and he wouldn't need to be seen by her or a doctor for another 2 weeks per last office note.   Patient had a question about setting up home health and stated that they had not heard from anyone as they were told they would. Please advise.

## 2021-07-04 NOTE — Progress Notes (Signed)
History of Present Illness  There is no documented history at this time  Assessments & Plan   There are no diagnoses linked to this encounter.    Additional instructions  Subjective:  Patient presents with venous ulcer of the Left lower extremity.    Procedure:  3 layer unna wrap was placed Left lower extremity.   Plan:   Follow up in one week.  

## 2021-07-06 NOTE — Telephone Encounter (Signed)
Home  Care for for this pt has been set up with [facility] they are suppose to be giving the pt a call. I will check up on it.

## 2021-07-11 ENCOUNTER — Ambulatory Visit (INDEPENDENT_AMBULATORY_CARE_PROVIDER_SITE_OTHER): Payer: Medicare Other | Admitting: Nurse Practitioner

## 2021-07-11 ENCOUNTER — Other Ambulatory Visit: Payer: Self-pay

## 2021-07-11 ENCOUNTER — Encounter (INDEPENDENT_AMBULATORY_CARE_PROVIDER_SITE_OTHER): Payer: Self-pay

## 2021-07-11 VITALS — BP 116/61 | HR 67 | Resp 16 | Wt 122.0 lb

## 2021-07-11 DIAGNOSIS — M7989 Other specified soft tissue disorders: Secondary | ICD-10-CM

## 2021-07-11 NOTE — Progress Notes (Signed)
Patient was taking out of wraps today. I recommended the patient to wear compressions stockings and if there are any changes for them contact the office. Patient can follow up in 8 weeks with Vivia Birmingham or Dr Wyn Quaker.

## 2021-07-13 IMAGING — CR DG CHEST 2V
2 series · 2 of 2 positions shown · non-contrast
Comparison: None.

CLINICAL DATA: Dyspnea.

EXAM:
CHEST - 2 VIEW

[chest pa]
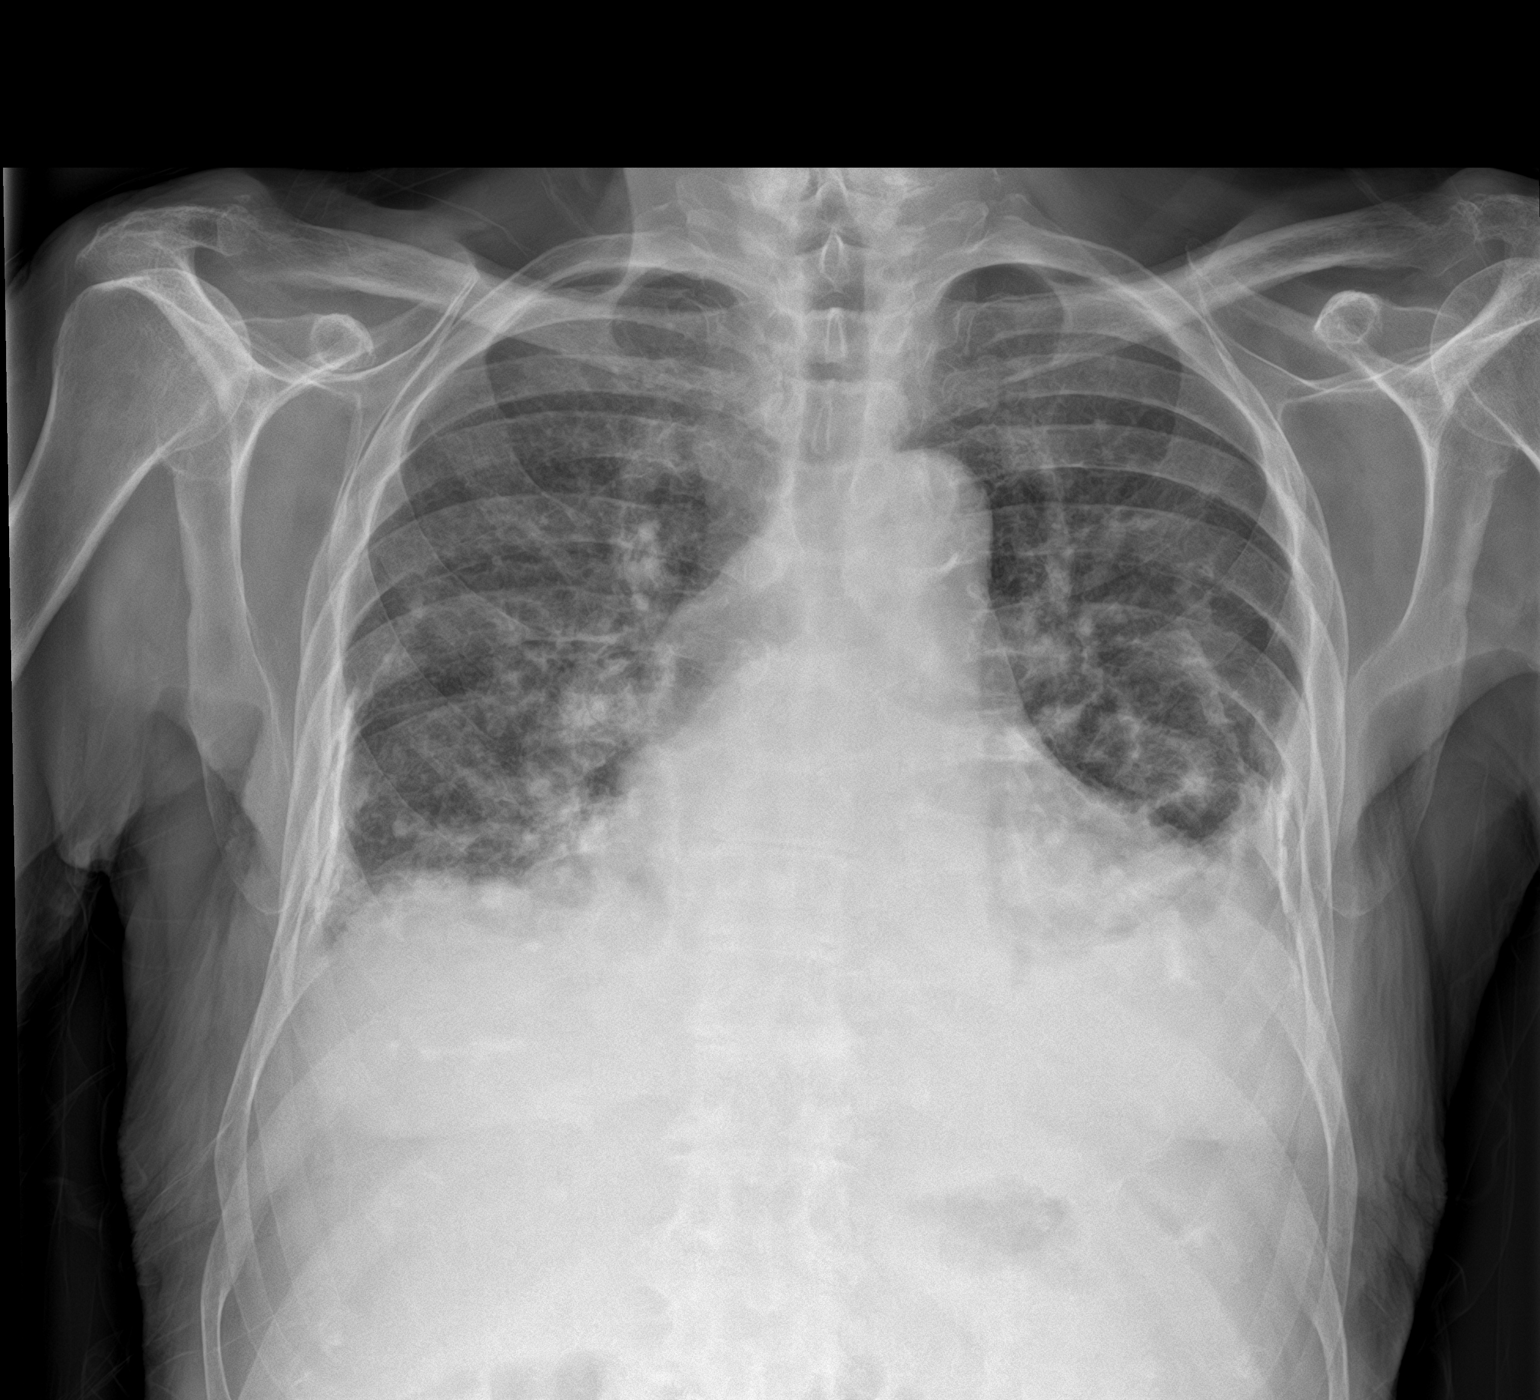

[chest lat]
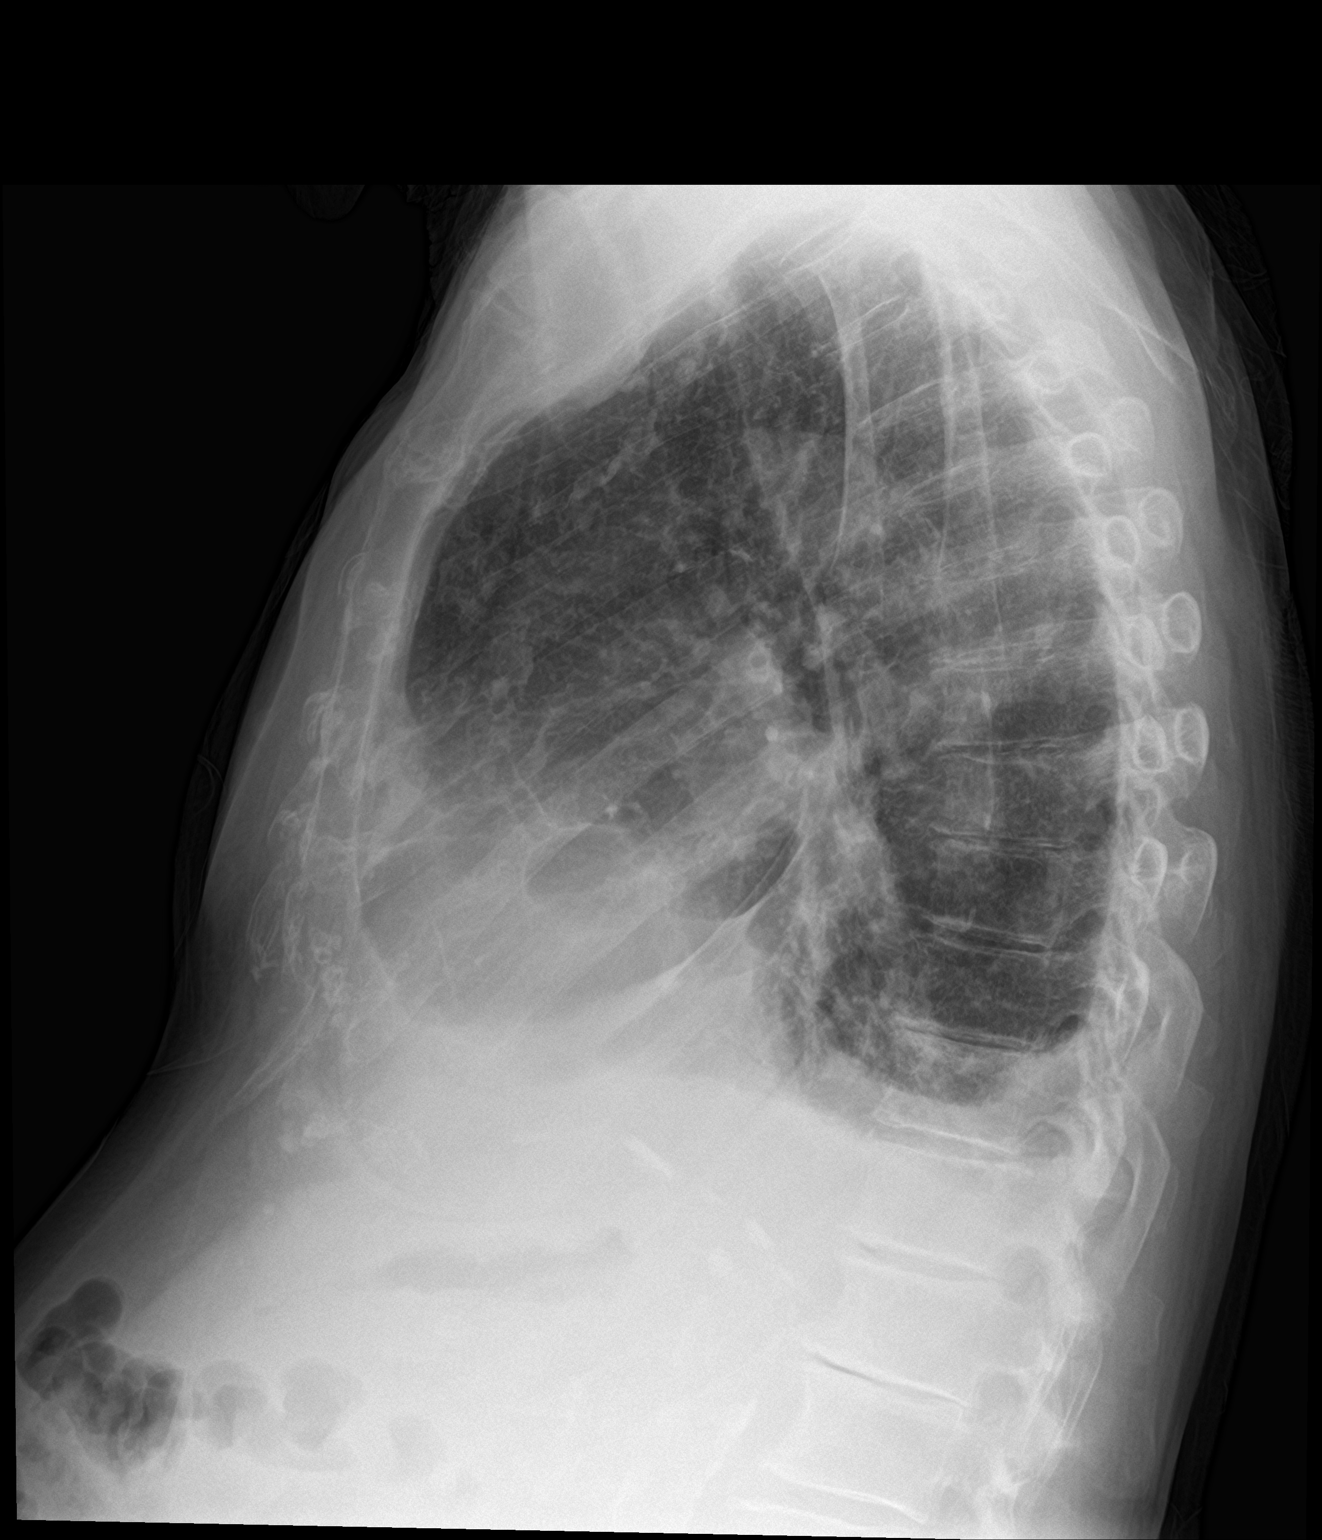

[2 of 2 positions shown; findings below may reference images not displayed]

FINDINGS: The cardiac silhouette is mildly enlarged. No mediastinal or hilar
masses or convincing adenopathy.

Small, left greater than right, pleural effusions. Associated lung
base opacity is noted consistent atelectasis. Pneumonia is not
excluded. There is central and lower lung interstitial prominence
without overt evidence of pulmonary edema. Pleural base
calcification noted laterally along both lower hemi thoraces.

No pneumothorax.

Skeletal structures are intact.
IMPRESSION: 1. Bilateral pleural effusions associated with basilar opacity, the
latter finding likely atelectasis/scarring. Pneumonia should be
considered in the proper clinical setting.
2. Central and lower lung interstitial prominence, that is most
likely chronic. No convincing pulmonary edema.
3. Pleural base calcifications along the lateral lower hemithoraces
consistent with prior asbestos exposure.
4. Mild cardiomegaly.

## 2021-07-14 ENCOUNTER — Encounter (INDEPENDENT_AMBULATORY_CARE_PROVIDER_SITE_OTHER): Payer: Self-pay | Admitting: Nurse Practitioner

## 2021-07-15 ENCOUNTER — Encounter (INDEPENDENT_AMBULATORY_CARE_PROVIDER_SITE_OTHER): Payer: Self-pay | Admitting: Nurse Practitioner

## 2021-07-18 ENCOUNTER — Telehealth (INDEPENDENT_AMBULATORY_CARE_PROVIDER_SITE_OTHER): Payer: Self-pay

## 2021-07-18 NOTE — Telephone Encounter (Signed)
Patient spouse left a voicemail requesting home health to start nursing for left unna boot. Patient spouse was made aware that orders were faxed but we will refax them today.

## 2021-07-23 ENCOUNTER — Ambulatory Visit: Payer: Medicare Other | Admitting: Hematology and Oncology

## 2021-07-23 ENCOUNTER — Other Ambulatory Visit: Payer: Medicare Other

## 2021-07-24 ENCOUNTER — Other Ambulatory Visit: Payer: Self-pay | Admitting: *Deleted

## 2021-07-24 DIAGNOSIS — D72829 Elevated white blood cell count, unspecified: Secondary | ICD-10-CM

## 2021-07-24 DIAGNOSIS — N289 Disorder of kidney and ureter, unspecified: Secondary | ICD-10-CM

## 2021-07-24 DIAGNOSIS — D649 Anemia, unspecified: Secondary | ICD-10-CM

## 2021-07-25 ENCOUNTER — Encounter: Payer: Self-pay | Admitting: Oncology

## 2021-07-25 ENCOUNTER — Inpatient Hospital Stay: Payer: Medicare Other | Attending: Oncology

## 2021-07-25 ENCOUNTER — Other Ambulatory Visit: Payer: Self-pay

## 2021-07-25 ENCOUNTER — Inpatient Hospital Stay (HOSPITAL_BASED_OUTPATIENT_CLINIC_OR_DEPARTMENT_OTHER): Payer: Medicare Other | Admitting: Oncology

## 2021-07-25 VITALS — BP 138/59 | HR 53 | Temp 98.1°F | Resp 16 | Wt 128.0 lb

## 2021-07-25 DIAGNOSIS — D72829 Elevated white blood cell count, unspecified: Secondary | ICD-10-CM | POA: Diagnosis not present

## 2021-07-25 DIAGNOSIS — N189 Chronic kidney disease, unspecified: Secondary | ICD-10-CM | POA: Insufficient documentation

## 2021-07-25 DIAGNOSIS — D631 Anemia in chronic kidney disease: Secondary | ICD-10-CM | POA: Diagnosis present

## 2021-07-25 DIAGNOSIS — D649 Anemia, unspecified: Secondary | ICD-10-CM

## 2021-07-25 DIAGNOSIS — N289 Disorder of kidney and ureter, unspecified: Secondary | ICD-10-CM

## 2021-07-25 LAB — CBC WITH DIFFERENTIAL/PLATELET
Abs Immature Granulocytes: 0.03 10*3/uL (ref 0.00–0.07)
Basophils Absolute: 0.1 10*3/uL (ref 0.0–0.1)
Basophils Relative: 1 %
Eosinophils Absolute: 0.2 10*3/uL (ref 0.0–0.5)
Eosinophils Relative: 3 %
HCT: 28.5 % — ABNORMAL LOW (ref 39.0–52.0)
Hemoglobin: 9.6 g/dL — ABNORMAL LOW (ref 13.0–17.0)
Immature Granulocytes: 0 %
Lymphocytes Relative: 25 %
Lymphs Abs: 1.8 10*3/uL (ref 0.7–4.0)
MCH: 30.8 pg (ref 26.0–34.0)
MCHC: 33.7 g/dL (ref 30.0–36.0)
MCV: 91.3 fL (ref 80.0–100.0)
Monocytes Absolute: 1 10*3/uL (ref 0.1–1.0)
Monocytes Relative: 14 %
Neutro Abs: 4.1 10*3/uL (ref 1.7–7.7)
Neutrophils Relative %: 57 %
Platelets: 194 10*3/uL (ref 150–400)
RBC: 3.12 MIL/uL — ABNORMAL LOW (ref 4.22–5.81)
RDW: 15.5 % (ref 11.5–15.5)
WBC: 7.2 10*3/uL (ref 4.0–10.5)
nRBC: 0 % (ref 0.0–0.2)

## 2021-07-25 LAB — COMPREHENSIVE METABOLIC PANEL
ALT: 15 U/L (ref 0–44)
AST: 24 U/L (ref 15–41)
Albumin: 4.4 g/dL (ref 3.5–5.0)
Alkaline Phosphatase: 123 U/L (ref 38–126)
Anion gap: 6 (ref 5–15)
BUN: 54 mg/dL — ABNORMAL HIGH (ref 8–23)
CO2: 25 mmol/L (ref 22–32)
Calcium: 9.2 mg/dL (ref 8.9–10.3)
Chloride: 105 mmol/L (ref 98–111)
Creatinine, Ser: 1.81 mg/dL — ABNORMAL HIGH (ref 0.61–1.24)
GFR, Estimated: 35 mL/min — ABNORMAL LOW (ref >60)
Glucose, Bld: 102 mg/dL — ABNORMAL HIGH (ref 70–99)
Potassium: 5.3 mmol/L — ABNORMAL HIGH (ref 3.5–5.1)
Sodium: 136 mmol/L (ref 135–145)
Total Bilirubin: 1 mg/dL (ref 0.3–1.2)
Total Protein: 7.4 g/dL (ref 6.5–8.1)

## 2021-07-25 LAB — FERRITIN: Ferritin: 226 ng/mL (ref 24–336)

## 2021-07-26 ENCOUNTER — Ambulatory Visit: Payer: Medicare Other | Admitting: Oncology

## 2021-07-26 ENCOUNTER — Other Ambulatory Visit: Payer: Medicare Other

## 2021-07-26 NOTE — Progress Notes (Signed)
Hematology/Oncology Consult note [facility]  [phone] [phone]  Patient Care Team: Dortha Kern, MD as PCP - General (Family Medicine)   Name of the patient: Adam Barajas  [id number]  12/29/1929   Date of visit: 07/26/21  Diagnosis-anemia of chronic disease and chronic kidney disease  Chief complaint/ Reason for visit-routine follow-up of anemia   Heme/Onc history: patient is a 87 year old male who was previously getting his care with Dr. Merlene Pulling for his anemia.He has had a complete anemia work-up in March 2022 which showed normal iron studies B12 folate.  Free light chain ratio 2.0 and SPEP revealed no M protein.  Flow cytometry showed no immunophenotypic abnormality.  He has had a history of elevated ferritin in the past with subsequently normalized and hemochromatosis assay was negative.  He does have chronic renal insufficiency with a creatinine that is currently around 2.3.     Interval history-patient is here with his friend today.  He is overall doing well at home and has not had any recent falls or hospitalizations.  Has baseline fatigue  ECOG PS- 1 Pain scale- 0   Review of systems- Review of Systems  Constitutional:  Positive for malaise/fatigue. Negative for chills, fever and weight loss.  HENT:  Negative for congestion, ear discharge and nosebleeds.   Eyes:  Negative for blurred vision.  Respiratory:  Negative for cough, hemoptysis, sputum production, shortness of breath and wheezing.   Cardiovascular:  Negative for chest pain, palpitations, orthopnea and claudication.  Gastrointestinal:  Negative for abdominal pain, blood in stool, constipation, diarrhea, heartburn, melena, nausea and vomiting.  Genitourinary:  Negative for dysuria, flank pain, frequency, hematuria and urgency.  Musculoskeletal:  Negative for back pain, joint pain and myalgias.  Skin:  Negative for rash.  Neurological:  Negative for dizziness,  tingling, focal weakness, seizures, weakness and headaches.  Endo/Heme/Allergies:  Does not bruise/bleed easily.  Psychiatric/Behavioral:  Negative for depression and suicidal ideas. The patient does not have insomnia.       Allergies  Allergen Reactions   Amoxicillin-Pot Clavulanate Hives   Poison Ivy Extract      Past Medical History:  Diagnosis Date   BPH (benign prostatic hyperplasia)    Chronic atrial fibrillation (HCC)    Hyperlipidemia    Hypertension      Past Surgical History:  Procedure Laterality Date   BACK SURGERY     EYE SURGERY Bilateral     Social History   Socioeconomic History   Marital status: Married    Spouse name: Not on file   Number of children: Not on file   Years of education: Not on file   Highest education level: Not on file  Occupational History   Not on file  Tobacco Use   Smoking status: Never   Smokeless tobacco: Never  Vaping Use   Vaping Use: Not on file  Substance and Sexual Activity   Alcohol use: Yes    Comment: 2-3  drinks per day.    Drug use: No   Sexual activity: Not on file  Other Topics Concern   Not on file  Social History Narrative   Not on file   Social Determinants of Health   Financial Resource Strain: Not on file  Food Insecurity: Not on file  Transportation Needs: Not on file  Physical Activity: Not on file  Stress: Not on file  Social Connections: Not on file  Intimate Partner Violence: Not on file    Family  History  Problem Relation Age of Onset   Hyperlipidemia Father      Current Outpatient Medications:    acetaminophen (TYLENOL) 500 MG tablet, Take 500 mg by mouth as needed., Disp: , Rfl:    apixaban (ELIQUIS) 2.5 MG TABS tablet, Take 1 tablet (2.5 mg total) by mouth 2 (two) times daily., Disp: 180 tablet, Rfl: 3   Ascorbic Acid (VITAMIN C) 1000 MG tablet, Take 1,000 mg by mouth daily., Disp: , Rfl:    furosemide (LASIX) 20 MG tablet, Take 1 tablet (20 mg) by mouth once every morning. You  may take 2 tablets (40 mg) twice a week as needed for increased swelling/ shortness of breath/ weight gain, Disp: , Rfl:    gabapentin (NEURONTIN) 100 MG capsule, Take 100 mg by mouth daily., Disp: , Rfl:    losartan (COZAAR) 25 MG tablet, Take 0.5 tablets (12.5 mg total) by mouth daily., Disp: , Rfl:    memantine (NAMENDA) 10 MG tablet, Take by mouth 2 (two) times daily., Disp: , Rfl:    tamsulosin (FLOMAX) 0.4 MG CAPS capsule, Take 0.4 mg by mouth daily., Disp: , Rfl:    OXYGEN, Inhale 3 L into the lungs daily. (Patient not taking: Reported on 07/25/2021), Disp: , Rfl:   Physical exam:  Vitals:   07/25/21 1429  BP: (!) 138/59  Pulse: (!) 53  Resp: 16  Temp: 98.1 F (36.7 C)  SpO2: 97%  Weight: 128 lb 0.3 oz (58.1 kg)   Physical Exam Constitutional:      General: He is not in acute distress. Cardiovascular:     Rate and Rhythm: Normal rate and regular rhythm.     Heart sounds: Normal heart sounds.  Pulmonary:     Effort: Pulmonary effort is normal.     Breath sounds: Normal breath sounds.  Abdominal:     General: Bowel sounds are normal.     Palpations: Abdomen is soft.  Skin:    General: Skin is warm and dry.  Neurological:     Mental Status: He is alert and oriented to person, place, and time.     CMP Latest Ref Rng & Units 07/25/2021  Glucose 70 - 99 mg/dL 149(F)  BUN 8 - 23 mg/dL 02(O)  Creatinine 3.78 - 1.24 mg/dL 5.88(F)  Sodium 027 - 741 mmol/L 136  Potassium 3.5 - 5.1 mmol/L 5.3(H)  Chloride 98 - 111 mmol/L 105  CO2 22 - 32 mmol/L 25  Calcium 8.9 - 10.3 mg/dL 9.2  Total Protein 6.5 - 8.1 g/dL 7.4  Total Bilirubin 0.3 - 1.2 mg/dL 1.0  Alkaline Phos 38 - 126 U/L 123  AST 15 - 41 U/L 24  ALT 0 - 44 U/L 15   CBC Latest Ref Rng & Units 07/25/2021  WBC 4.0 - 10.5 K/uL 7.2  Hemoglobin 13.0 - 17.0 g/dL 2.8(N)  Hematocrit 86.7 - 52.0 % 28.5(L)  Platelets 150 - 400 K/uL 194     Assessment and plan- Patient is a 87 y.o. male With history of anemia of chronic  kidney disease here for a routine follow-up  Patient's hemoglobin is remained stable between 9.5-10.5 over the last 1 year.  Ferritin levels up to 26 within normal limits.  He does have a component of chronic kidney disease given that his creatinine fluctuates between 1.5-1.8.  Given that his hemoglobin is close to 10 at this time no need to initiate EPO just yet.  EPO would be indicated if hemoglobin drifts down to less than  9.  CBC ferritin and iron studies and CMP in 3 in 6 months and I will see him back in 6 months.  He has had an elevated free light chain ratio in the past and I will repeat that in 3 months as well   Visit Diagnosis 1. Leukocytosis, unspecified type   2. Anemia, unspecified type      Dr. Owens Shark, MD, MPH [facility] [phone] 07/26/2021 2:54 PM

## 2021-08-14 ENCOUNTER — Other Ambulatory Visit: Payer: Self-pay | Admitting: *Deleted

## 2021-08-14 MED ORDER — APIXABAN 2.5 MG PO TABS: 2.5000 mg | ORAL_TABLET | Freq: Two times a day (BID) | ORAL | 1 refills | Status: AC

## 2021-08-14 NOTE — Telephone Encounter (Signed)
Eliquis 2.5mg  paper refill request received. Patient is 87 years old, weight-58.1kg, Crea-1.81 on 07/25/2021, Diagnosis-Afib, and last seen by Dr. Graciela Husbands on 03/29/2021. Dose is appropriate based on dosing criteria. Will send in refill to requested pharmacy.

## 2021-08-28 ENCOUNTER — Telehealth: Payer: Self-pay | Admitting: Oncology

## 2021-08-29 NOTE — Telephone Encounter (Signed)
Forward to team 

## 2021-09-05 ENCOUNTER — Ambulatory Visit (INDEPENDENT_AMBULATORY_CARE_PROVIDER_SITE_OTHER): Payer: Medicare Other | Admitting: Nurse Practitioner

## 2021-09-19 ENCOUNTER — Ambulatory Visit (INDEPENDENT_AMBULATORY_CARE_PROVIDER_SITE_OTHER): Payer: Medicare Other | Admitting: Nurse Practitioner

## 2021-09-19 ENCOUNTER — Other Ambulatory Visit: Payer: Self-pay

## 2021-09-19 VITALS — BP 114/69 | HR 59 | Ht 67.0 in | Wt 134.0 lb

## 2021-09-19 DIAGNOSIS — I10 Essential (primary) hypertension: Secondary | ICD-10-CM | POA: Diagnosis not present

## 2021-09-19 DIAGNOSIS — M7989 Other specified soft tissue disorders: Secondary | ICD-10-CM

## 2021-09-23 ENCOUNTER — Encounter (INDEPENDENT_AMBULATORY_CARE_PROVIDER_SITE_OTHER): Payer: Self-pay | Admitting: Nurse Practitioner

## 2021-09-23 NOTE — Progress Notes (Signed)
Subjective:    Patient ID: Adam Barajas, male    DOB: [birth year], 87 y.o.   MRN: [id number] Chief Complaint  Patient presents with  . Follow-up    8 wk no studies    Adam Barajas is an 87 year old male that presents today for follow-up evaluation of lower extremity edema.  Previously the patient was placed in Unna wraps and he was subsequently removed for short time however developed an ulceration of his left ankle area.  This ulceration developed spontaneously.  He has been having home health due to milligrams.  The wound is mostly scabbed over but there is evidence of some ulceration under the scabbing.  There is no purulent drainage.   Review of Systems  Skin:  Positive for wound.  All other systems reviewed and are negative.     Objective:   Physical Exam Vitals reviewed.  HENT:     Head: Normocephalic.  Cardiovascular:     Rate and Rhythm: Normal rate.     Pulses: Normal pulses.  Pulmonary:     Effort: Pulmonary effort is normal.  Musculoskeletal:     Cervical back: Normal range of motion.     Left lower leg: Edema present.  Skin:    General: Skin is warm and dry.       Neurological:     Mental Status: He is alert and oriented to person, place, and time.  Psychiatric:        Mood and Affect: Mood normal.        Behavior: Behavior normal.        Thought Content: Thought content normal.        Judgment: Judgment normal.    BP 114/69   Pulse (!) 59   Ht 5\' 7"  (1.702 m)   Wt 134 lb (60.8 kg)   BMI 20.99 kg/m   Past Medical History:  Diagnosis Date  . BPH (benign prostatic hyperplasia)   . Chronic atrial fibrillation (HCC)   . Hyperlipidemia   . Hypertension     Social History   Socioeconomic History  . Marital status: Married    Spouse name: Not on file  . Number of children: Not on file  . Years of education: Not on file  . Highest education level: Not on file  Occupational History  . Not on file  Tobacco Use  . Smoking status: Never  .  Smokeless tobacco: Never  Vaping Use  . Vaping Use: Not on file  Substance and Sexual Activity  . Alcohol use: Yes    Comment: 2-3  drinks per day.   . Drug use: No  . Sexual activity: Not on file  Other Topics Concern  . Not on file  Social History Narrative  . Not on file   Social Determinants of Health   Financial Resource Strain: Not on file  Food Insecurity: Not on file  Transportation Needs: Not on file  Physical Activity: Not on file  Stress: Not on file  Social Connections: Not on file  Intimate Partner Violence: Not on file    Past Surgical History:  Procedure Laterality Date  . BACK SURGERY    . EYE SURGERY Bilateral     Family History  Problem Relation Age of Onset  . Hyperlipidemia Father     Allergies  Allergen Reactions  . Amoxicillin-Pot Clavulanate Hives  . Poison Ivy Extract     CBC Latest Ref Rng & Units 07/25/2021 02/08/2021 01/17/2021  WBC 4.0 - 10.5 K/uL 7.2  8.2 6.5  Hemoglobin 13.0 - 17.0 g/dL 9.6(L) 10.2(L) 9.3(L)  Hematocrit 39.0 - 52.0 % 28.5(L) 31.3(L) 28.6(L)  Platelets 150 - 400 K/uL 194 199 228      CMP     Component Value Date/Time   NA 136 07/25/2021 1420   NA 141 02/08/2021 1426   K 5.3 (H) 07/25/2021 1420   CL 105 07/25/2021 1420   CO2 25 07/25/2021 1420   GLUCOSE 102 (H) 07/25/2021 1420   BUN 54 (H) 07/25/2021 1420   BUN 31 (H) 02/08/2021 1426   CREATININE 1.81 (H) 07/25/2021 1420   CALCIUM 9.2 07/25/2021 1420   PROT 7.4 07/25/2021 1420   ALBUMIN 4.4 07/25/2021 1420   AST 24 07/25/2021 1420   ALT 15 07/25/2021 1420   ALKPHOS 123 07/25/2021 1420   BILITOT 1.0 07/25/2021 1420   GFRNONAA 35 (L) 07/25/2021 1420   GFRAA 26 (L) 12/28/2020 1201     No results found.     Assessment & Plan:   1. Swelling of limb The patient's wound appears to be very nearly healed.  There is a lot of scabbing over the area.  To ensure that the wound completely heals we will continue with Unna wraps for the patient.  He will present  to the office on a weekly basis for wrap changes.  We will reevaluate the progress in approximately 4 weeks.  2. Essential hypertension Continue antihypertensive medications as already ordered, these medications have been reviewed and there are no changes at this time.    Current Outpatient Medications on File Prior to Visit  Medication Sig Dispense Refill  . acetaminophen (TYLENOL) 500 MG tablet Take 500 mg by mouth as needed.    Marland Kitchen apixaban (ELIQUIS) 2.5 MG TABS tablet Take 1 tablet (2.5 mg total) by mouth 2 (two) times daily. 180 tablet 1  . Ascorbic Acid (VITAMIN C) 1000 MG tablet Take 1,000 mg by mouth daily.    . furosemide (LASIX) 20 MG tablet Take 1 tablet (20 mg) by mouth once every morning. You may take 2 tablets (40 mg) twice a week as needed for increased swelling/ shortness of breath/ weight gain    . gabapentin (NEURONTIN) 100 MG capsule Take 100 mg by mouth daily.    . memantine (NAMENDA) 10 MG tablet Take by mouth 2 (two) times daily.    . OXYGEN Inhale 3 L into the lungs daily.    . tamsulosin (FLOMAX) 0.4 MG CAPS capsule Take 0.4 mg by mouth daily.    Marland Kitchen losartan (COZAAR) 25 MG tablet Take 0.5 tablets (12.5 mg total) by mouth daily.     No current facility-administered medications on file prior to visit.    There are no Patient Instructions on file for this visit. No follow-ups on file.   Kris Hartmann, NP

## 2021-09-26 ENCOUNTER — Ambulatory Visit (INDEPENDENT_AMBULATORY_CARE_PROVIDER_SITE_OTHER): Payer: Medicare Other | Admitting: Nurse Practitioner

## 2021-09-26 ENCOUNTER — Other Ambulatory Visit: Payer: Self-pay

## 2021-09-26 VITALS — BP 131/72 | HR 57 | Ht 65.0 in | Wt 134.0 lb

## 2021-09-26 DIAGNOSIS — L97201 Non-pressure chronic ulcer of unspecified calf limited to breakdown of skin: Secondary | ICD-10-CM | POA: Diagnosis not present

## 2021-09-26 NOTE — Progress Notes (Signed)
History of Present Illness  There is no documented history at this time  Assessments & Plan   There are no diagnoses linked to this encounter.    Additional instructions  Subjective:  Patient presents with venous ulcer of the Left lower extremity.    Procedure:  3 layer unna wrap was placed Left lower extremity.   Plan:   Follow up in one week.  

## 2021-09-30 ENCOUNTER — Encounter (INDEPENDENT_AMBULATORY_CARE_PROVIDER_SITE_OTHER): Payer: Self-pay | Admitting: Nurse Practitioner

## 2021-10-03 ENCOUNTER — Encounter (INDEPENDENT_AMBULATORY_CARE_PROVIDER_SITE_OTHER): Payer: Self-pay

## 2021-10-03 ENCOUNTER — Other Ambulatory Visit: Payer: Self-pay

## 2021-10-03 ENCOUNTER — Ambulatory Visit (INDEPENDENT_AMBULATORY_CARE_PROVIDER_SITE_OTHER): Payer: Medicare Other | Admitting: Nurse Practitioner

## 2021-10-03 VITALS — BP 143/70 | HR 62 | Resp 16 | Wt 137.2 lb

## 2021-10-03 DIAGNOSIS — L97201 Non-pressure chronic ulcer of unspecified calf limited to breakdown of skin: Secondary | ICD-10-CM

## 2021-10-03 NOTE — Progress Notes (Signed)
History of Present Illness  There is no documented history at this time  Assessments & Plan   There are no diagnoses linked to this encounter.    Additional instructions  Subjective:  Patient presents with venous ulcer of the Left lower extremity.    Procedure:  3 layer unna wrap was placed Left lower extremity.   Plan:   Follow up in one week.  

## 2021-10-07 ENCOUNTER — Encounter (INDEPENDENT_AMBULATORY_CARE_PROVIDER_SITE_OTHER): Payer: Self-pay | Admitting: Nurse Practitioner

## 2021-10-10 ENCOUNTER — Ambulatory Visit (INDEPENDENT_AMBULATORY_CARE_PROVIDER_SITE_OTHER): Payer: Medicare Other | Admitting: Nurse Practitioner

## 2021-10-10 ENCOUNTER — Other Ambulatory Visit: Payer: Self-pay

## 2021-10-10 VITALS — BP 129/67 | HR 62 | Ht 67.0 in | Wt 134.0 lb

## 2021-10-10 DIAGNOSIS — L97201 Non-pressure chronic ulcer of unspecified calf limited to breakdown of skin: Secondary | ICD-10-CM | POA: Diagnosis not present

## 2021-10-10 NOTE — Progress Notes (Signed)
History of Present Illness  There is no documented history at this time  Assessments & Plan   There are no diagnoses linked to this encounter.    Additional instructions  Subjective:  Patient presents with venous ulcer of the Left lower extremity.    Procedure:  3 layer unna wrap was placed Left lower extremity.   Plan:   Follow up in one week.  

## 2021-10-14 ENCOUNTER — Encounter (INDEPENDENT_AMBULATORY_CARE_PROVIDER_SITE_OTHER): Payer: Self-pay | Admitting: Nurse Practitioner

## 2021-10-15 ENCOUNTER — Ambulatory Visit (INDEPENDENT_AMBULATORY_CARE_PROVIDER_SITE_OTHER): Payer: Medicare Other | Admitting: Nurse Practitioner

## 2021-10-15 ENCOUNTER — Encounter (INDEPENDENT_AMBULATORY_CARE_PROVIDER_SITE_OTHER): Payer: Self-pay | Admitting: Nurse Practitioner

## 2021-10-15 ENCOUNTER — Other Ambulatory Visit: Payer: Self-pay

## 2021-10-15 VITALS — BP 113/61 | HR 69 | Resp 16 | Wt 134.0 lb

## 2021-10-15 DIAGNOSIS — N183 Chronic kidney disease, stage 3 unspecified: Secondary | ICD-10-CM

## 2021-10-15 DIAGNOSIS — L97201 Non-pressure chronic ulcer of unspecified calf limited to breakdown of skin: Secondary | ICD-10-CM

## 2021-10-15 DIAGNOSIS — I10 Essential (primary) hypertension: Secondary | ICD-10-CM

## 2021-10-21 ENCOUNTER — Encounter (INDEPENDENT_AMBULATORY_CARE_PROVIDER_SITE_OTHER): Payer: Self-pay | Admitting: Nurse Practitioner

## 2021-10-21 NOTE — Progress Notes (Signed)
Subjective:    Patient ID: Adam Barajas, male    DOB: [birth year], 87 y.o.   MRN: [id number] Chief Complaint  Patient presents with   Follow-up    4wk unna boot follow up    Ryson Blackham is an 87 year old male that presents today for follow-up evaluation of lower extremity edema.  Previously the patient was placed in [place] wraps and he was subsequently removed for short time however developed an ulceration of his left ankle area.  This as well as the swelling has improved after several weeks of being replaced into Unna wraps.  He notes that he is feeling that things are much improved and would like to come out of the wraps at this time.   Review of Systems  Cardiovascular:  Positive for leg swelling.  Skin:  Positive for wound.  All other systems reviewed and are negative.     Objective:   Physical Exam Vitals reviewed.  HENT:     Head: Normocephalic.  Cardiovascular:     Rate and Rhythm: Normal rate.     Pulses: Normal pulses.  Pulmonary:     Effort: Pulmonary effort is normal.  Skin:    General: Skin is warm and dry.     Capillary Refill: Capillary refill takes less than 2 seconds.  Neurological:     Mental Status: He is alert and oriented to person, place, and time.  Psychiatric:        Mood and Affect: Mood normal.        Behavior: Behavior normal.        Thought Content: Thought content normal.        Judgment: Judgment normal.    BP 113/61 (BP Location: Left Arm)    Pulse 69    Resp 16    Wt 134 lb (60.8 kg)    BMI 20.99 kg/m   Past Medical History:  Diagnosis Date   BPH (benign prostatic hyperplasia)    Chronic atrial fibrillation (HCC)    Hyperlipidemia    Hypertension     Social History   Socioeconomic History   Marital status: Married    Spouse name: Not on file   Number of children: Not on file   Years of education: Not on file   Highest education level: Not on file  Occupational History   Not on file  Tobacco Use   Smoking status: Never    Smokeless tobacco: Never  Vaping Use   Vaping Use: Not on file  Substance and Sexual Activity   Alcohol use: Yes    Comment: 2-3  drinks per day.    Drug use: No   Sexual activity: Not on file  Other Topics Concern   Not on file  Social History Narrative   Not on file   Social Determinants of Health   Financial Resource Strain: Not on file  Food Insecurity: Not on file  Transportation Needs: Not on file  Physical Activity: Not on file  Stress: Not on file  Social Connections: Not on file  Intimate Partner Violence: Not on file    Past Surgical History:  Procedure Laterality Date   BACK SURGERY     EYE SURGERY Bilateral     Family History  Problem Relation Age of Onset   Hyperlipidemia Father     Allergies  Allergen Reactions   Amoxicillin-Pot Clavulanate Hives   Poison Ivy Extract     CBC Latest Ref Rng & Units 07/25/2021 02/08/2021 01/17/2021  WBC 4.0 -  10.5 K/uL 7.2 8.2 6.5  Hemoglobin 13.0 - 17.0 g/dL 6.2(E) 10.2(L) 9.3(L)  Hematocrit 39.0 - 52.0 % 28.5(L) 31.3(L) 28.6(L)  Platelets 150 - 400 K/uL 194 199 228      CMP     Component Value Date/Time   NA 136 07/25/2021 1420   NA 141 02/08/2021 1426   K 5.3 (H) 07/25/2021 1420   CL 105 07/25/2021 1420   CO2 25 07/25/2021 1420   GLUCOSE 102 (H) 07/25/2021 1420   BUN 54 (H) 07/25/2021 1420   BUN 31 (H) 02/08/2021 1426   CREATININE 1.81 (H) 07/25/2021 1420   CALCIUM 9.2 07/25/2021 1420   PROT 7.4 07/25/2021 1420   ALBUMIN 4.4 07/25/2021 1420   AST 24 07/25/2021 1420   ALT 15 07/25/2021 1420   ALKPHOS 123 07/25/2021 1420   BILITOT 1.0 07/25/2021 1420   GFRNONAA 35 (L) 07/25/2021 1420   GFRAA 26 (L) 12/28/2020 1201     No results found.     Assessment & Plan:   1. Skin ulcer of calf, limited to breakdown of skin, unspecified laterality (HCC) The patient's swelling is greatly under control however there is still the scabbing behind the ankle area.  However this is also improved.  Based on this the  patient would like to come out of the boot or wraps.  He is advised to use some sort of compression.  Is noted that compression socks have previously been difficult so Ace wrap's are suggested.  Patient was also advised to utilize other conservative measures including elevation and activity.  We will have the patient return to the office in 3 months to evaluate lower extremity edema or sooner if issues arise.  2. Essential hypertension Continue antihypertensive medications as already ordered, these medications have been reviewed and there are no changes at this time.   3. Stage 3 chronic kidney disease, unspecified whether stage 3a or 3b CKD (HCC) This will likely exacerbate lower extremity edema.   Current Outpatient Medications on File Prior to Visit  Medication Sig Dispense Refill   acetaminophen (TYLENOL) 500 MG tablet Take 500 mg by mouth as needed.     apixaban (ELIQUIS) 2.5 MG TABS tablet Take 1 tablet (2.5 mg total) by mouth 2 (two) times daily. 180 tablet 1   Ascorbic Acid (VITAMIN C) 1000 MG tablet Take 1,000 mg by mouth daily.     furosemide (LASIX) 20 MG tablet Take 1 tablet (20 mg) by mouth once every morning. You may take 2 tablets (40 mg) twice a week as needed for increased swelling/ shortness of breath/ weight gain     gabapentin (NEURONTIN) 100 MG capsule Take 100 mg by mouth daily.     memantine (NAMENDA) 10 MG tablet Take by mouth 2 (two) times daily.     OXYGEN Inhale 3 L into the lungs daily.     tamsulosin (FLOMAX) 0.4 MG CAPS capsule Take 0.4 mg by mouth daily.     losartan (COZAAR) 25 MG tablet Take 0.5 tablets (12.5 mg total) by mouth daily.     No current facility-administered medications on file prior to visit.    There are no Patient Instructions on file for this visit. No follow-ups on file.   Georgiana Spinner, NP

## 2021-10-24 ENCOUNTER — Inpatient Hospital Stay: Payer: Medicare Other | Attending: Oncology

## 2021-10-24 ENCOUNTER — Other Ambulatory Visit: Payer: Self-pay

## 2021-10-24 DIAGNOSIS — D72829 Elevated white blood cell count, unspecified: Secondary | ICD-10-CM | POA: Insufficient documentation

## 2021-10-24 DIAGNOSIS — D649 Anemia, unspecified: Secondary | ICD-10-CM | POA: Insufficient documentation

## 2021-10-24 LAB — FERRITIN: Ferritin: 188 ng/mL (ref 24–336)

## 2021-10-24 LAB — CBC WITH DIFFERENTIAL/PLATELET
Abs Immature Granulocytes: 0.05 10*3/uL (ref 0.00–0.07)
Basophils Absolute: 0.1 10*3/uL (ref 0.0–0.1)
Basophils Relative: 2 %
Eosinophils Absolute: 0.6 10*3/uL — ABNORMAL HIGH (ref 0.0–0.5)
Eosinophils Relative: 8 %
HCT: 31.7 % — ABNORMAL LOW (ref 39.0–52.0)
Hemoglobin: 10.4 g/dL — ABNORMAL LOW (ref 13.0–17.0)
Immature Granulocytes: 1 %
Lymphocytes Relative: 24 %
Lymphs Abs: 1.8 10*3/uL (ref 0.7–4.0)
MCH: 31 pg (ref 26.0–34.0)
MCHC: 32.8 g/dL (ref 30.0–36.0)
MCV: 94.3 fL (ref 80.0–100.0)
Monocytes Absolute: 0.9 10*3/uL (ref 0.1–1.0)
Monocytes Relative: 12 %
Neutro Abs: 4.2 10*3/uL (ref 1.7–7.7)
Neutrophils Relative %: 53 %
Platelets: 238 10*3/uL (ref 150–400)
RBC: 3.36 MIL/uL — ABNORMAL LOW (ref 4.22–5.81)
RDW: 13 % (ref 11.5–15.5)
WBC: 7.7 10*3/uL (ref 4.0–10.5)
nRBC: 0 % (ref 0.0–0.2)

## 2021-10-24 LAB — COMPREHENSIVE METABOLIC PANEL
ALT: 15 U/L (ref 0–44)
AST: 25 U/L (ref 15–41)
Albumin: 4.2 g/dL (ref 3.5–5.0)
Alkaline Phosphatase: 125 U/L (ref 38–126)
Anion gap: 6 (ref 5–15)
BUN: 41 mg/dL — ABNORMAL HIGH (ref 8–23)
CO2: 26 mmol/L (ref 22–32)
Calcium: 9.1 mg/dL (ref 8.9–10.3)
Chloride: 106 mmol/L (ref 98–111)
Creatinine, Ser: 1.77 mg/dL — ABNORMAL HIGH (ref 0.61–1.24)
GFR, Estimated: 36 mL/min — ABNORMAL LOW (ref >60)
Glucose, Bld: 102 mg/dL — ABNORMAL HIGH (ref 70–99)
Potassium: 4.3 mmol/L (ref 3.5–5.1)
Sodium: 138 mmol/L (ref 135–145)
Total Bilirubin: 1 mg/dL (ref 0.3–1.2)
Total Protein: 7.6 g/dL (ref 6.5–8.1)

## 2021-10-24 LAB — IRON AND TIBC
Iron: 60 ug/dL (ref 45–182)
Saturation Ratios: 21 % (ref 17.9–39.5)
TIBC: 286 ug/dL (ref 250–450)
UIBC: 226 ug/dL

## 2021-10-25 LAB — KAPPA/LAMBDA LIGHT CHAINS
Kappa free light chain: 52.2 mg/L — ABNORMAL HIGH (ref 3.3–19.4)
Kappa, lambda light chain ratio: 2.11 — ABNORMAL HIGH (ref 0.26–1.65)
Lambda free light chains: 24.7 mg/L (ref 5.7–26.3)

## 2021-11-08 ENCOUNTER — Encounter: Payer: Self-pay | Admitting: Internal Medicine

## 2021-11-08 ENCOUNTER — Other Ambulatory Visit: Payer: Self-pay

## 2021-11-08 ENCOUNTER — Ambulatory Visit (INDEPENDENT_AMBULATORY_CARE_PROVIDER_SITE_OTHER): Payer: Medicare Other | Admitting: Internal Medicine

## 2021-11-08 VITALS — BP 140/70 | HR 44 | Ht 65.0 in | Wt 134.2 lb

## 2021-11-08 DIAGNOSIS — I428 Other cardiomyopathies: Secondary | ICD-10-CM

## 2021-11-08 DIAGNOSIS — I5020 Unspecified systolic (congestive) heart failure: Secondary | ICD-10-CM | POA: Diagnosis not present

## 2021-11-08 DIAGNOSIS — I4821 Permanent atrial fibrillation: Secondary | ICD-10-CM | POA: Diagnosis not present

## 2021-11-08 NOTE — Patient Instructions (Signed)
Medication Instructions:  - Your physician recommends that you continue on your current medications as directed. Please refer to the Current Medication list given to you today.  *If you need a refill on your cardiac medications before your next appointment, please call your pharmacy*   Lab Work: - none ordered  If you have labs (blood work) drawn today and your tests are completely normal, you will receive your results only by: MyChart Message (if you have MyChart) OR A paper copy in the mail If you have any lab test that is abnormal or we need to change your treatment, we will call you to review the results.   Testing/Procedures: - none ordered   Follow-Up: At [facility], you and your health needs are our priority.  As part of our continuing mission to provide you with exceptional heart care, we have created designated Provider Care Teams.  These Care Teams include your primary Cardiologist (physician) and Advanced Practice Providers (APPs -  Physician Assistants and Nurse Practitioners) who all work together to provide you with the care you need, when you need it.  We recommend signing up for the patient portal called "MyChart".  Sign up information is provided on this After Visit Summary.  MyChart is used to connect with patients for Virtual Visits (Telemedicine).  Patients are able to view lab/test results, encounter notes, upcoming appointments, etc.  Non-urgent messages can be sent to your provider as well.   To learn more about what you can do with MyChart, go to [email or web address].    Your next appointment:   1 year(s)  The format for your next appointment:   In Person  Provider:   Sherryl Manges, MD    Other Instructions - Isometric Contractions prior to standing - Lower extremity compression

## 2021-11-08 NOTE — Progress Notes (Signed)
Patient Care Team: Lynnell Jude, MD as PCP - General (Family Medicine)   HPI  Adam Barajas is a 88 y.o. male Seen in follow-up for exercise associated dyspnea in context of permanent atrial fibrillation, anticoagulation with apixaban, and a mild  nonischemic cardiomyopathy and  baseline left bundle branch block.    Cardiopulmonary stress testing >> maximum heart rate was 121    The patient denies chest pain, shortness of breath, nocturnal dyspnea, orthopnea or peripheral edema.  There have been no palpitations or syncope.  Complains of significant and reproducible dizziness upon standing.  Does not occur   no bleeding  Memory still an issue, wife exhausted ended up with a knee replacement which did well      DATE TEST EF   6/17    myoview  52 %  no ischemia  6/17 echo  40-45 %  Mod LAE  9/21 Echo  35-40% PA pres// RV dysfun-mild RAE severe/LAE mod   Date Cr K Hgb  1/17 1.0 4.9    8/17    12.7  10/18 0.95 4.6 12.6  7/19   12.6  5/21 1.5 4.6 10.2<<8.2  8/21   9.3  4/22 1.6 4.8 10.2  12/22 1.77 4.3 10.4<<9.6     Records and Results Reviewed   Past Medical History:  Diagnosis Date   BPH (benign prostatic hyperplasia)    Chronic atrial fibrillation (HCC)    Hyperlipidemia    Hypertension     Past Surgical History:  Procedure Laterality Date   BACK SURGERY     EYE SURGERY Bilateral     Current Outpatient Medications  Medication Sig Dispense Refill   acetaminophen (TYLENOL) 500 MG tablet Take 500 mg by mouth as needed.     apixaban (ELIQUIS) 2.5 MG TABS tablet Take 1 tablet (2.5 mg total) by mouth 2 (two) times daily. 180 tablet 1   Ascorbic Acid (VITAMIN C) 1000 MG tablet Take 1,000 mg by mouth daily.     furosemide (LASIX) 20 MG tablet Take 1 tablet (20 mg) by mouth once every morning. You may take 2 tablets (40 mg) twice a week as needed for increased swelling/ shortness of breath/ weight gain     gabapentin (NEURONTIN) 100 MG capsule Take 100 mg by  mouth daily.     losartan (COZAAR) 25 MG tablet Take 0.5 tablets (12.5 mg total) by mouth daily.     memantine (NAMENDA) 10 MG tablet Take by mouth 2 (two) times daily.     OXYGEN Inhale 3 L into the lungs daily.     tamsulosin (FLOMAX) 0.4 MG CAPS capsule Take 0.4 mg by mouth daily.     No current facility-administered medications for this visit.    Allergies  Allergen Reactions   Amoxicillin-Pot Clavulanate Hives   Poison Ivy Extract       Review of Systems negative except from HPI and PMH  Physical Exam BP 140/70 (BP Location: Left Arm, Patient Position: Sitting, Cuff Size: Normal)    Pulse (!) 44    Ht 5\' 5"  (1.651 m)    Wt 134 lb 3.2 oz (60.9 kg)    SpO2 96%    BMI 22.33 kg/m  Well developed and nourished in no acute distress HENT normal Neck supple  Clear Irregularly Irregular rate and rhythm with slow  controlled  ventricular response , no murmurs or gallops Abd-soft with active BS No Clubbing cyanosis edema Skin-warm and dry A & Oriented  Grossly  normal sensory and motor function  ECG        Assessment and  Plan   Cardiomyopathy-mild  Atrial fibrillation-permanent  Bradycardia  Hypertension   Orthostatic lightheadedness  Dyspnea/CHF chronic class 2B  Dementia  End of life discussion  Renal insuff grade 3  Again discussed palliative care she remains reluctant  Atrial fibrillation is permanent.  Rate control adequate.  No bleeding on apixaban We will continue 2.5 mg twice daily  Orthostatic lightheadedness is not associated with orthostatic hypotension or tachycardia at least on assessment today.  Still, given the history, I am concerned that that is the issue and so we have reviewed the tension value of isometric contraction of his lower extremities prior to standing either with ankle cross or knee push as well as the potential benefits of an abdominal binder or thigh sleeves.  He is euvolemic.  We will continue to furosemide 20 mg daily  In the  absence of a blood pressure change, we will continue the low-dose losartan for his myopathy at 12.5 mg daily.

## 2022-01-09 ENCOUNTER — Ambulatory Visit (INDEPENDENT_AMBULATORY_CARE_PROVIDER_SITE_OTHER): Payer: Medicare Other | Admitting: Nurse Practitioner

## 2022-01-10 ENCOUNTER — Ambulatory Visit (INDEPENDENT_AMBULATORY_CARE_PROVIDER_SITE_OTHER): Payer: Self-pay | Admitting: Nurse Practitioner

## 2022-01-11 ENCOUNTER — Ambulatory Visit (INDEPENDENT_AMBULATORY_CARE_PROVIDER_SITE_OTHER): Payer: Self-pay | Admitting: Nurse Practitioner

## 2022-01-16 ENCOUNTER — Other Ambulatory Visit: Payer: Self-pay | Admitting: *Deleted

## 2022-01-16 DIAGNOSIS — D649 Anemia, unspecified: Secondary | ICD-10-CM

## 2022-01-22 ENCOUNTER — Other Ambulatory Visit: Payer: Medicare Other

## 2022-01-22 ENCOUNTER — Ambulatory Visit: Payer: Medicare Other | Admitting: Oncology

## 2022-01-22 ENCOUNTER — Telehealth: Payer: Medicare Other | Admitting: Oncology

## 2022-01-23 ENCOUNTER — Other Ambulatory Visit: Payer: Self-pay

## 2022-01-23 ENCOUNTER — Inpatient Hospital Stay: Payer: Medicare Other | Admitting: Oncology

## 2022-01-23 ENCOUNTER — Encounter: Payer: Self-pay | Admitting: Oncology

## 2022-01-23 ENCOUNTER — Inpatient Hospital Stay: Payer: Medicare Other | Attending: Oncology

## 2022-01-23 VITALS — BP 109/70 | HR 59 | Temp 97.2°F | Resp 18 | Wt 148.1 lb

## 2022-01-23 DIAGNOSIS — D649 Anemia, unspecified: Secondary | ICD-10-CM | POA: Diagnosis not present

## 2022-01-23 DIAGNOSIS — Z79899 Other long term (current) drug therapy: Secondary | ICD-10-CM | POA: Diagnosis not present

## 2022-01-23 DIAGNOSIS — D72829 Elevated white blood cell count, unspecified: Secondary | ICD-10-CM

## 2022-01-23 DIAGNOSIS — D631 Anemia in chronic kidney disease: Secondary | ICD-10-CM | POA: Diagnosis present

## 2022-01-23 DIAGNOSIS — N189 Chronic kidney disease, unspecified: Secondary | ICD-10-CM | POA: Insufficient documentation

## 2022-01-23 LAB — COMPREHENSIVE METABOLIC PANEL
ALT: 14 U/L (ref 0–44)
AST: 26 U/L (ref 15–41)
Albumin: 4.1 g/dL (ref 3.5–5.0)
Alkaline Phosphatase: 149 U/L — ABNORMAL HIGH (ref 38–126)
Anion gap: 9 (ref 5–15)
BUN: 49 mg/dL — ABNORMAL HIGH (ref 8–23)
CO2: 24 mmol/L (ref 22–32)
Calcium: 8.9 mg/dL (ref 8.9–10.3)
Chloride: 105 mmol/L (ref 98–111)
Creatinine, Ser: 2.3 mg/dL — ABNORMAL HIGH (ref 0.61–1.24)
GFR, Estimated: 26 mL/min — ABNORMAL LOW (ref >60)
Glucose, Bld: 145 mg/dL — ABNORMAL HIGH (ref 70–99)
Potassium: 4.8 mmol/L (ref 3.5–5.1)
Sodium: 138 mmol/L (ref 135–145)
Total Bilirubin: 0.8 mg/dL (ref 0.3–1.2)
Total Protein: 7.2 g/dL (ref 6.5–8.1)

## 2022-01-23 LAB — CBC WITH DIFFERENTIAL/PLATELET
Abs Immature Granulocytes: 0.03 10*3/uL (ref 0.00–0.07)
Basophils Absolute: 0.1 10*3/uL (ref 0.0–0.1)
Basophils Relative: 1 %
Eosinophils Absolute: 0.2 10*3/uL (ref 0.0–0.5)
Eosinophils Relative: 4 %
HCT: 31.4 % — ABNORMAL LOW (ref 39.0–52.0)
Hemoglobin: 9.9 g/dL — ABNORMAL LOW (ref 13.0–17.0)
Immature Granulocytes: 1 %
Lymphocytes Relative: 21 %
Lymphs Abs: 1.4 10*3/uL (ref 0.7–4.0)
MCH: 30.3 pg (ref 26.0–34.0)
MCHC: 31.5 g/dL (ref 30.0–36.0)
MCV: 96 fL (ref 80.0–100.0)
Monocytes Absolute: 1.1 10*3/uL — ABNORMAL HIGH (ref 0.1–1.0)
Monocytes Relative: 16 %
Neutro Abs: 3.8 10*3/uL (ref 1.7–7.7)
Neutrophils Relative %: 57 %
Platelets: 198 10*3/uL (ref 150–400)
RBC: 3.27 MIL/uL — ABNORMAL LOW (ref 4.22–5.81)
RDW: 14.3 % (ref 11.5–15.5)
WBC: 6.6 10*3/uL (ref 4.0–10.5)
nRBC: 0 % (ref 0.0–0.2)

## 2022-01-23 LAB — IRON AND TIBC
Iron: 36 ug/dL — ABNORMAL LOW (ref 45–182)
Saturation Ratios: 12 % — ABNORMAL LOW (ref 17.9–39.5)
TIBC: 290 ug/dL (ref 250–450)
UIBC: 254 ug/dL

## 2022-01-23 LAB — FERRITIN: Ferritin: 132 ng/mL (ref 24–336)

## 2022-01-23 NOTE — Progress Notes (Signed)
Celluitis has started back up, wife states they are currently wrapped up because they are leaking, stays red and swollen for about five or more days and then in the middle of the ight started leaking. Still leaking when sleeping at night, it has helped wrapping but still leaking through.  ?

## 2022-01-24 LAB — KAPPA/LAMBDA LIGHT CHAINS
Kappa free light chain: 52.1 mg/L — ABNORMAL HIGH (ref 3.3–19.4)
Kappa, lambda light chain ratio: 2.16 — ABNORMAL HIGH (ref 0.26–1.65)
Lambda free light chains: 24.1 mg/L (ref 5.7–26.3)

## 2022-01-24 NOTE — Progress Notes (Signed)
? ? ? ?Hematology/Oncology Consult note ?[place]  ?[phone] [phone]) [phone] ? ?Patient Care Team: ?Lynnell Jude, MD as PCP - General (Family Medicine)  ? ?Name of the patient: Adam Barajas  ?[id number]  ?April 12, 1930  ? ?Date of visit: 01/24/22 ? ?Diagnosis- anemia of chronic disease and chronic kidney disease ? ?Chief complaint/ Reason for visit-routine follow-up of anemia ? ?Heme/Onc history: patient is a 88 year old male who was previously getting his care with Dr. Mike Gip for his anemia.He has had a complete anemia work-up in March 2022 which showed ?normal iron studies B12 folate.  Free light chain ratio 2.0 and SPEP revealed no M protein.  Flow cytometry showed no immunophenotypic abnormality.  He has had a history of elevated ferritin in the past with subsequently normalized and hemochromatosis assay was negative.  He does have chronic renal insufficiency with a creatinine that is currently around 2.3.   ? ?Interval history-  Overall he is doing well for his age.  He has not had any recent falls hospitalizations.  He does have bilateral lower extremity swelling and gets episodes of cellulitis about once a year.  He is currently recovering from an episode of cellulitis and was given doxycycline. ? ?ECOG PS- 2 ?Pain scale- 0 ? ? ?Review of systems- Review of Systems  ?Constitutional:  Positive for malaise/fatigue. Negative for chills, fever and weight loss.  ?HENT:  Negative for congestion, ear discharge and nosebleeds.   ?Eyes:  Negative for blurred vision.  ?Respiratory:  Negative for cough, hemoptysis, sputum production, shortness of breath and wheezing.   ?Cardiovascular:  Negative for chest pain, palpitations, orthopnea and claudication.  ?Gastrointestinal:  Negative for abdominal pain, blood in stool, constipation, diarrhea, heartburn, melena, nausea and vomiting.  ?Genitourinary:  Negative for dysuria, flank pain, frequency, hematuria and urgency.   ?Musculoskeletal:  Negative for back pain, joint pain and myalgias.  ?Skin:  Negative for rash.  ?Neurological:  Negative for dizziness, tingling, focal weakness, seizures, weakness and headaches.  ?Endo/Heme/Allergies:  Does not bruise/bleed easily.  ?Psychiatric/Behavioral:  Negative for depression and suicidal ideas. The patient does not have insomnia.    ? ? ?Allergies  ?Allergen Reactions  ? Amoxicillin-Pot Clavulanate Hives  ? Poison Ivy Extract   ? ? ? ?Past Medical History:  ?Diagnosis Date  ? BPH (benign prostatic hyperplasia)   ? Chronic atrial fibrillation (HCC)   ? Hyperlipidemia   ? Hypertension   ? ? ? ?Past Surgical History:  ?Procedure Laterality Date  ? BACK SURGERY    ? EYE SURGERY Bilateral   ? ? ?Social History  ? ?Socioeconomic History  ? Marital status: Married  ?  Spouse name: Not on file  ? Number of children: Not on file  ? Years of education: Not on file  ? Highest education level: Not on file  ?Occupational History  ? Not on file  ?Tobacco Use  ? Smoking status: Never  ? Smokeless tobacco: Never  ?Vaping Use  ? Vaping Use: Not on file  ?Substance and Sexual Activity  ? Alcohol use: Yes  ?  Comment: 2-3  drinks per day.   ? Drug use: No  ? Sexual activity: Not on file  ?Other Topics Concern  ? Not on file  ?Social History Narrative  ? Not on file  ? ?Social Determinants of Health  ? ?Financial Resource Strain: Not on file  ?Food Insecurity: Not on file  ?Transportation Needs: Not on file  ?Physical Activity: Not on file  ?Stress:  Not on file  ?Social Connections: Not on file  ?Intimate Partner Violence: Not on file  ? ? ?Family History  ?Problem Relation Age of Onset  ? Hyperlipidemia Father   ? ? ? ?Current Outpatient Medications:  ?  apixaban (ELIQUIS) 2.5 MG TABS tablet, Take 1 tablet (2.5 mg total) by mouth 2 (two) times daily., Disp: 180 tablet, Rfl: 1 ?  Ascorbic Acid (VITAMIN C) 1000 MG tablet, Take 1,000 mg by mouth daily., Disp: , Rfl:  ?  doxycycline (VIBRA-TABS) 100 MG tablet,  Take by mouth., Disp: , Rfl:  ?  furosemide (LASIX) 20 MG tablet, Take 1 tablet (20 mg) by mouth once every morning. You may take 2 tablets (40 mg) twice a week as needed for increased swelling/ shortness of breath/ weight gain, Disp: , Rfl:  ?  gabapentin (NEURONTIN) 100 MG capsule, Take 100 mg by mouth daily., Disp: , Rfl:  ?  losartan (COZAAR) 25 MG tablet, Take 0.5 tablets (12.5 mg total) by mouth daily., Disp: , Rfl:  ?  memantine (NAMENDA) 10 MG tablet, Take by mouth 2 (two) times daily., Disp: , Rfl:  ?  omeprazole (PRILOSEC) 20 MG capsule, Take by mouth., Disp: , Rfl:  ?  ondansetron (ZOFRAN) 4 MG tablet, TAKE 1 TABLET BY MOUTH EVERY 6 TO 8 HOURS AS NEEDED FOR NAUSEA, Disp: , Rfl:  ?  OXYGEN, Inhale 3 L into the lungs daily., Disp: , Rfl:  ?  tamsulosin (FLOMAX) 0.4 MG CAPS capsule, Take 0.4 mg by mouth daily., Disp: , Rfl:  ?  acetaminophen (TYLENOL) 500 MG tablet, Take 500 mg by mouth as needed. (Patient not taking: Reported on 01/23/2022), Disp: , Rfl:  ? ?Physical exam:  ?Vitals:  ? 01/23/22 1429  ?BP: 109/70  ?Pulse: (!) 59  ?Resp: 18  ?Temp: (!) 97.2 ?F (36.2 ?C)  ?SpO2: 95%  ?Weight: 148 lb 1.6 oz (67.2 kg)  ? ?Physical Exam ?Constitutional:   ?   Comments: Appears in no acute distress. Sitting in a wheelchair  ?Cardiovascular:  ?   Rate and Rhythm: Normal rate and regular rhythm.  ?   Heart sounds: Normal heart sounds.  ?Pulmonary:  ?   Effort: Pulmonary effort is normal.  ?   Breath sounds: Normal breath sounds.  ?Abdominal:  ?   General: Bowel sounds are normal.  ?   Palpations: Abdomen is soft.  ?Musculoskeletal:  ?   Comments: Bilateral lower extremity dressings in place  ?Skin: ?   General: Skin is warm and dry.  ?Neurological:  ?   Mental Status: He is alert and oriented to person, place, and time.  ?  ? ? ?  Latest Ref Rng & Units 01/23/2022  ?  1:44 PM  ?CMP  ?Glucose 70 - 99 mg/dL 145    ?BUN 8 - 23 mg/dL 49    ?Creatinine 0.61 - 1.24 mg/dL 2.30    ?Sodium 135 - 145 mmol/L 138    ?Potassium  3.5 - 5.1 mmol/L 4.8    ?Chloride 98 - 111 mmol/L 105    ?CO2 22 - 32 mmol/L 24    ?Calcium 8.9 - 10.3 mg/dL 8.9    ?Total Protein 6.5 - 8.1 g/dL 7.2    ?Total Bilirubin 0.3 - 1.2 mg/dL 0.8    ?Alkaline Phos 38 - 126 U/L 149    ?AST 15 - 41 U/L 26    ?ALT 0 - 44 U/L 14    ? ? ?  Latest Ref Rng &  Units 01/23/2022  ?  1:44 PM  ?CBC  ?WBC 4.0 - 10.5 K/uL 6.6    ?Hemoglobin 13.0 - 17.0 g/dL 9.9    ?Hematocrit 39.0 - 52.0 % 31.4    ?Platelets 150 - 400 K/uL 198    ? ? ? ?Assessment and plan- Patient is a 88 y.o. male with history of anemia of chronic disease and anemia of chronic kidney disease here for routine follow-up ? ?Patient's hemoglobin is presently remained stable between 9.5-10.5.  It is 9.9 today.Iron studies show a normal ferritin of 132 with an iron saturation of 12%.  I will hold off on giving him any IV iron given the stability of his hemoglobin.  We will hold off on EPO until hemoglobin drifts down consistently to less than 9.  I will see him back in 8 months with an interim CBC and ferritin and iron studies to be checked in 4 months ?  ?Visit Diagnosis ?1. Anemia, unspecified type   ? ? ? ?Dr. Randa Evens, MD, MPH ?[facility] ?[id number] ?01/24/2022 ?1:31 PM ? ? ? ? ? ? ?    ? ? ? ? ? ?

## 2022-04-04 DEATH — deceased

## 2022-05-22 ENCOUNTER — Other Ambulatory Visit: Payer: Medicare Other

## 2022-09-25 ENCOUNTER — Ambulatory Visit: Payer: Medicare Other | Admitting: Oncology

## 2022-09-25 ENCOUNTER — Other Ambulatory Visit: Payer: Medicare Other
